# Patient Record
Sex: Male | Born: 1970 | Race: White | Hispanic: No | Marital: Single | Smoking: Current every day smoker
Health system: Southern US, Community
[De-identification: ages and names within clinical notes are randomized; demographics above are authoritative.]

## PROBLEM LIST (undated history)

## (undated) DIAGNOSIS — I10 Essential (primary) hypertension: Secondary | ICD-10-CM

## (undated) DIAGNOSIS — I219 Acute myocardial infarction, unspecified: Secondary | ICD-10-CM

## (undated) HISTORY — DX: Essential (primary) hypertension: I10

## (undated) HISTORY — PX: CARDIAC CATHETERIZATION: SHX172

## (undated) HISTORY — DX: Acute myocardial infarction, unspecified: I21.9

---

## 2006-06-12 ENCOUNTER — Emergency Department: Payer: Self-pay | Admitting: Emergency Medicine

## 2006-12-21 ENCOUNTER — Emergency Department: Payer: Self-pay | Admitting: Emergency Medicine

## 2007-01-26 ENCOUNTER — Emergency Department: Payer: Self-pay | Admitting: Emergency Medicine

## 2007-01-27 ENCOUNTER — Inpatient Hospital Stay: Payer: Self-pay | Admitting: Internal Medicine

## 2007-11-13 ENCOUNTER — Emergency Department: Payer: Self-pay | Admitting: Internal Medicine

## 2009-07-20 IMAGING — CT CT STONE STUDY
1 of 2 series · 15 of 32 positions shown, 19 images · non-contrast
Comparison: none

REASON FOR EXAM: Flank pain
COMMENTS:

[Series 2: stone · axial · 0.70mm/px · z∈[-1024,-622]mm · 15 of 151 slices shown, 19 images]
[im 11/151  soft-tissue]
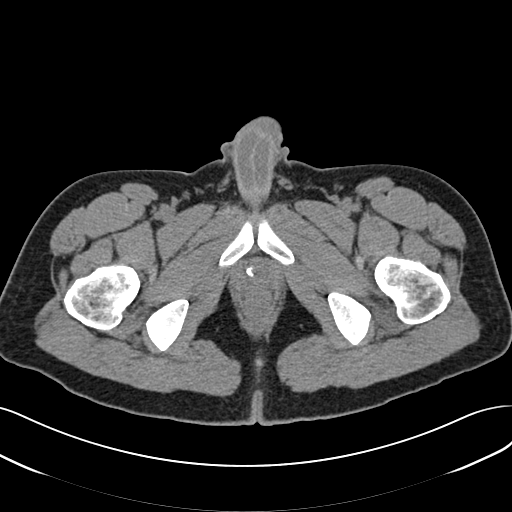
[im 11/151  bone]
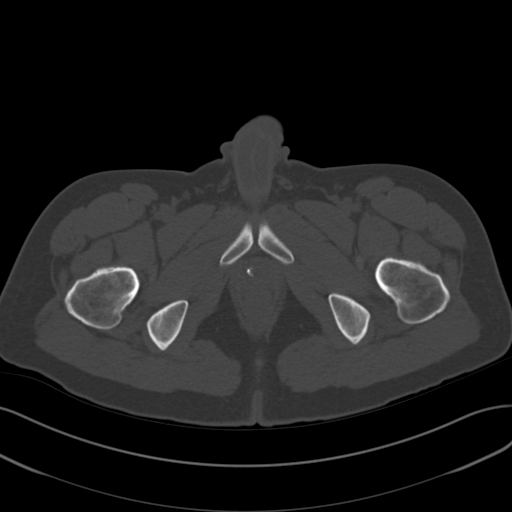
[im 22/151  soft-tissue]
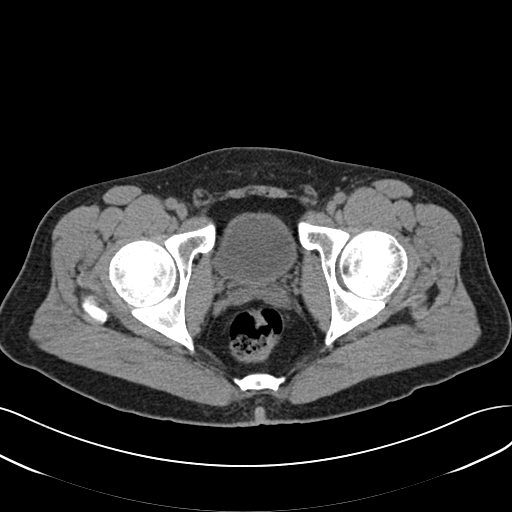
[im 33/151  soft-tissue]
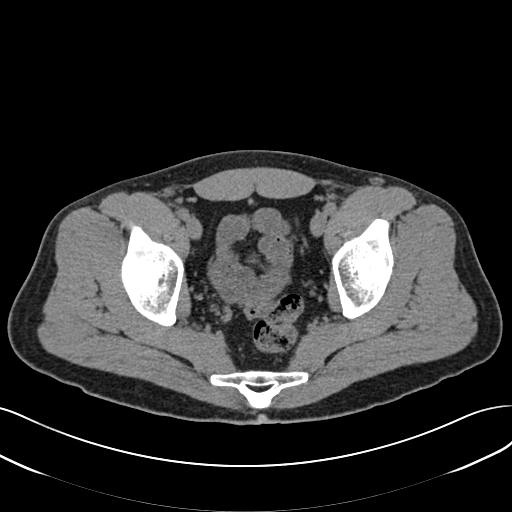
[im 43/151  soft-tissue]
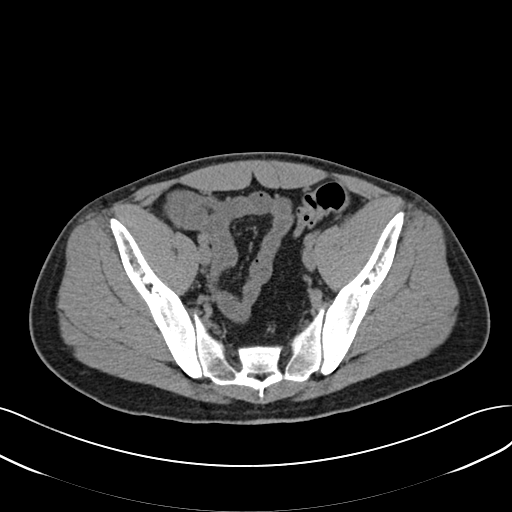
[im 54/151  soft-tissue]
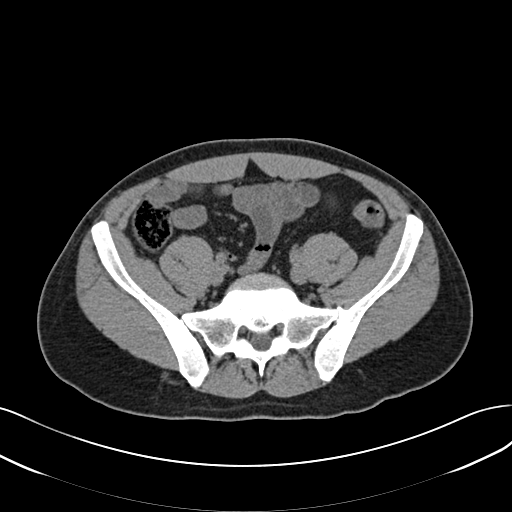
[im 65/151  soft-tissue]
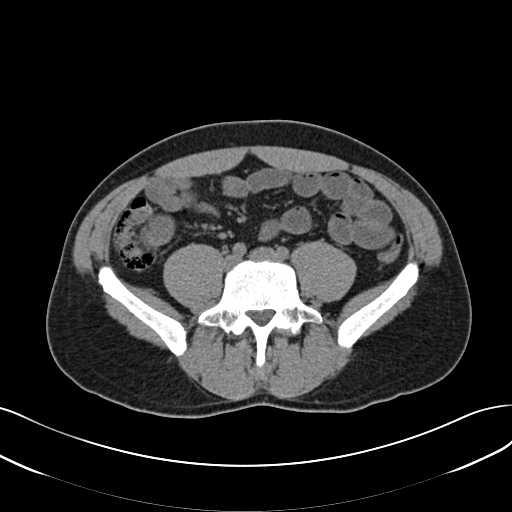
[im 76/151  soft-tissue]
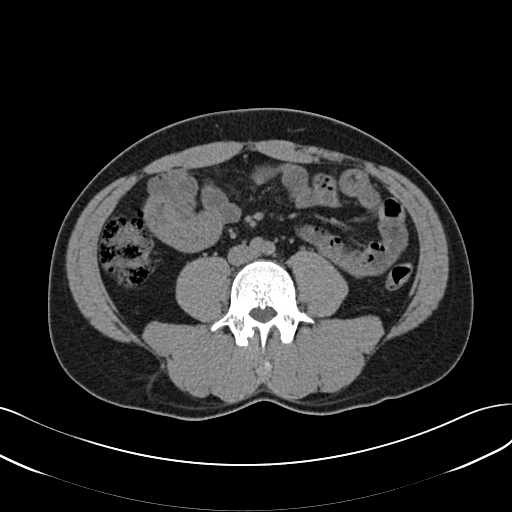
[im 86/151  soft-tissue]
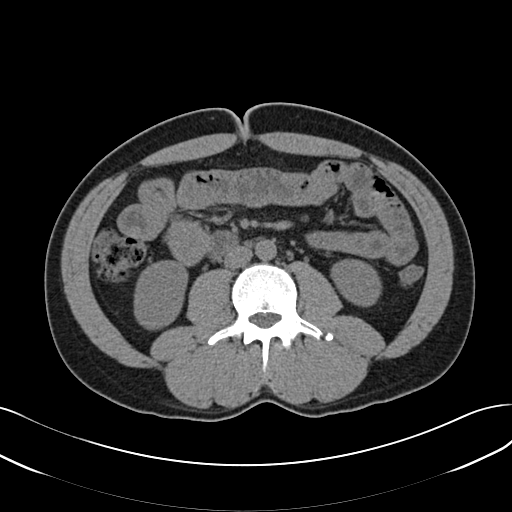
[im 97/151  soft-tissue]
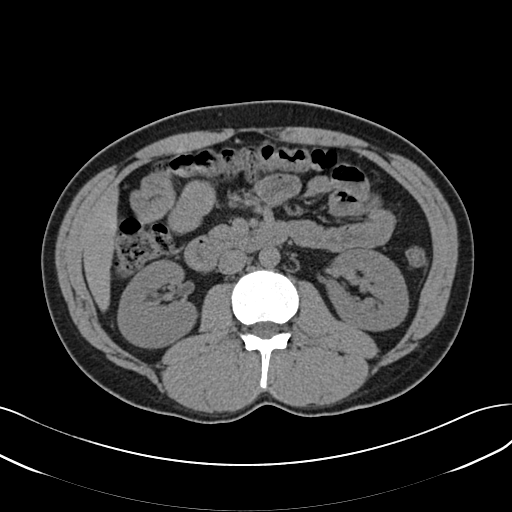
[im 97/151  bone]
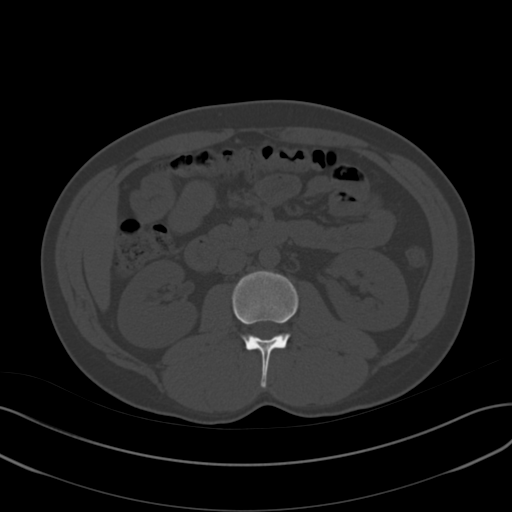
[im 108/151  soft-tissue]
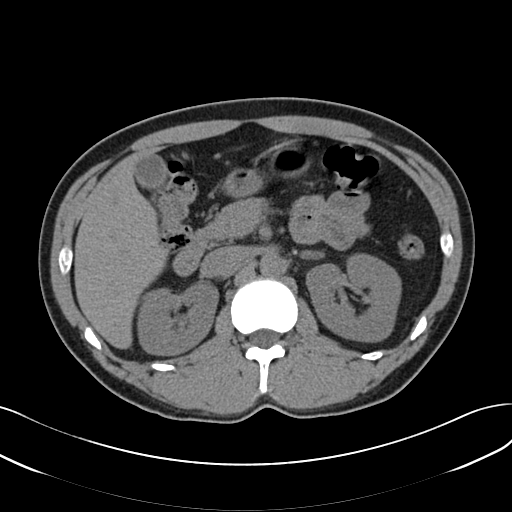
[im 118/151  soft-tissue]
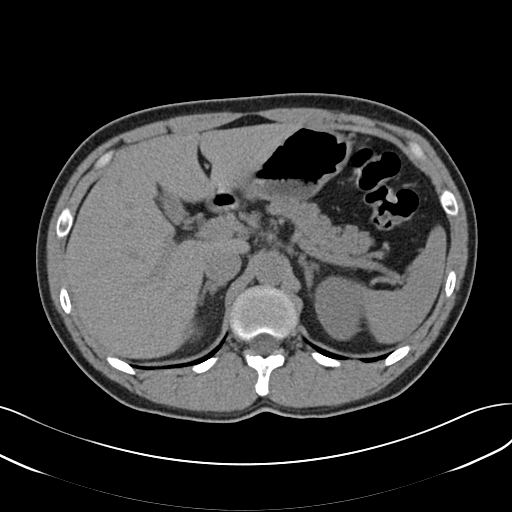
[im 129/151  soft-tissue]
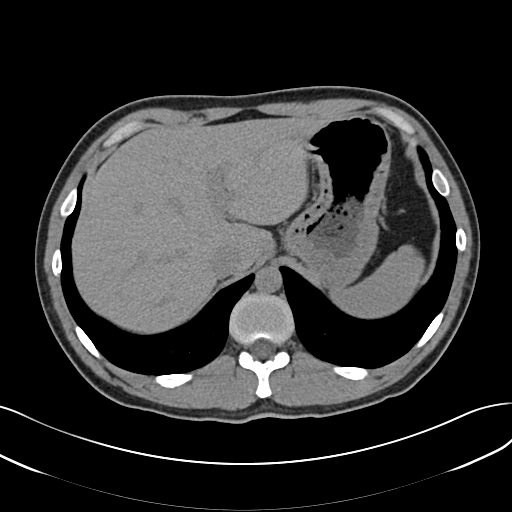
[im 129/151  lung]
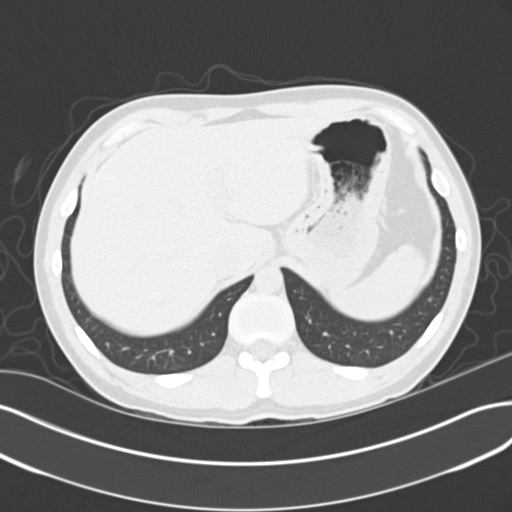
[im 134/151  lung]
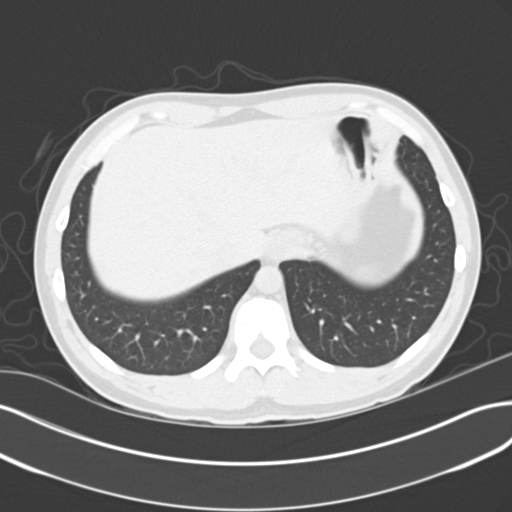
[im 140/151  soft-tissue]
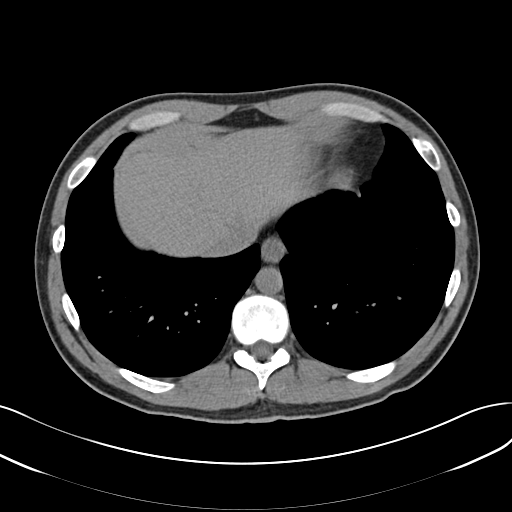
[im 140/151  lung]
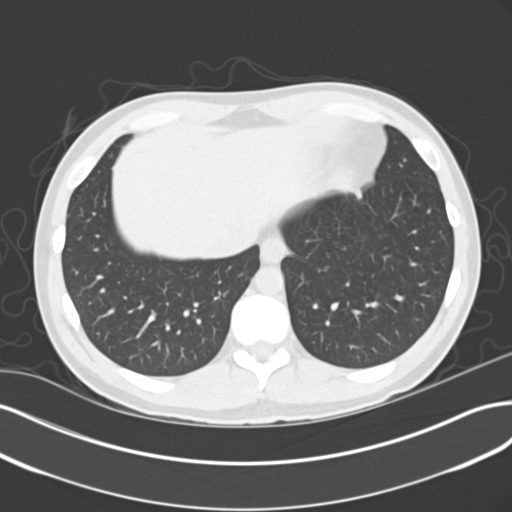
[im 145/151  lung]
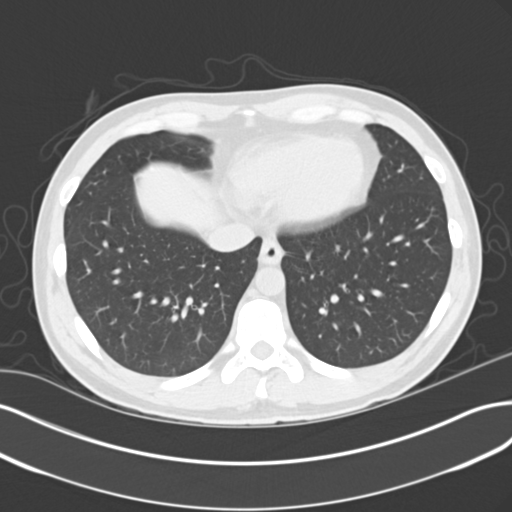

[15 of 32 positions shown; findings below may reference images not displayed]

PROCEDURE:     CT  - CT ABDOMEN /PELVIS WO (STONE)  - December 21, 2006  [DATE]

RESULT:     The study was performed without IV contrast as requested.

The kidneys exhibit normal density and contour. There is no hydronephrosis.
No calcified stones are seen. The unopacified loops of small and large bowel
exhibit no evidence of obstruction or colitis or diverticulitis. Specific
attention to the sigmoid colon reveals no acute abnormality. There is a
structure that appears to reflect a normal appendix containing gas. There is
no free fluid in the abdomen or pelvis. The urinary bladder, prostate gland
and seminal vesicles are normal for the noncontrast study. There is no
inguinal hernia and no bulky pelvic or inguinal lymph nodes are identified.

The caliber of the abdominal aorta is normal. There are no adrenal masses.
The spleen, stomach, pancreas, gallbladder and liver exhibit no acute
abnormality. The lung bases are clear.
IMPRESSION: 1.  I do not see evidence of acute urinary tract obstruction, stones or
inflammatory change.
2.  There is no evidence of diverticulitis or acute appendicitis or other
forms of bowel abnormality.
3.  There is no evidence of ascites and no evidence of hepatobiliary
abnormality.

This report was called to Dr. Genga in the Emergency Room at the
conclusion of the study.

## 2009-10-23 ENCOUNTER — Emergency Department: Payer: Self-pay | Admitting: Emergency Medicine

## 2016-03-23 ENCOUNTER — Inpatient Hospital Stay (HOSPITAL_COMMUNITY)
Admit: 2016-03-23 | Discharge: 2016-03-23 | Disposition: A | Discharge status: Home / Self Care | Payer: Self-pay | Attending: Internal Medicine | Admitting: Internal Medicine

## 2016-03-23 ENCOUNTER — Emergency Department: Payer: Self-pay

## 2016-03-23 ENCOUNTER — Inpatient Hospital Stay
Admission: EM | Admit: 2016-03-23 | Discharge: 2016-03-25 | DRG: 282 | Disposition: A | Discharge status: Home / Self Care | Payer: Self-pay | Attending: Cardiovascular Disease | Admitting: Cardiovascular Disease

## 2016-03-23 ENCOUNTER — Encounter: Payer: Self-pay | Admitting: Radiology

## 2016-03-23 DIAGNOSIS — R9431 Abnormal electrocardiogram [ECG] [EKG]: Secondary | ICD-10-CM

## 2016-03-23 DIAGNOSIS — F4310 Post-traumatic stress disorder, unspecified: Secondary | ICD-10-CM

## 2016-03-23 DIAGNOSIS — A419 Sepsis, unspecified organism: Secondary | ICD-10-CM

## 2016-03-23 DIAGNOSIS — F1411 Cocaine abuse, in remission: Secondary | ICD-10-CM

## 2016-03-23 DIAGNOSIS — R51 Headache: Secondary | ICD-10-CM | POA: Diagnosis present

## 2016-03-23 DIAGNOSIS — I214 Non-ST elevation (NSTEMI) myocardial infarction: Principal | ICD-10-CM

## 2016-03-23 DIAGNOSIS — Z91199 Patient's noncompliance with other medical treatment and regimen due to unspecified reason: Secondary | ICD-10-CM

## 2016-03-23 DIAGNOSIS — Z800 Family history of malignant neoplasm of digestive organs: Secondary | ICD-10-CM

## 2016-03-23 DIAGNOSIS — Z803 Family history of malignant neoplasm of breast: Secondary | ICD-10-CM

## 2016-03-23 DIAGNOSIS — G4700 Insomnia, unspecified: Secondary | ICD-10-CM | POA: Diagnosis present

## 2016-03-23 DIAGNOSIS — F17210 Nicotine dependence, cigarettes, uncomplicated: Secondary | ICD-10-CM | POA: Diagnosis present

## 2016-03-23 DIAGNOSIS — F1210 Cannabis abuse, uncomplicated: Secondary | ICD-10-CM

## 2016-03-23 DIAGNOSIS — F410 Panic disorder [episodic paroxysmal anxiety] without agoraphobia: Secondary | ICD-10-CM | POA: Diagnosis present

## 2016-03-23 DIAGNOSIS — R1033 Periumbilical pain: Secondary | ICD-10-CM

## 2016-03-23 DIAGNOSIS — Z8249 Family history of ischemic heart disease and other diseases of the circulatory system: Secondary | ICD-10-CM

## 2016-03-23 DIAGNOSIS — R519 Headache, unspecified: Secondary | ICD-10-CM

## 2016-03-23 DIAGNOSIS — Z9119 Patient's noncompliance with other medical treatment and regimen: Secondary | ICD-10-CM

## 2016-03-23 LAB — URINALYSIS, ROUTINE W REFLEX MICROSCOPIC
BACTERIA UA: NONE SEEN
Bilirubin Urine: NEGATIVE
Glucose, UA: NEGATIVE mg/dL
KETONES UR: NEGATIVE mg/dL
Leukocytes, UA: NEGATIVE
Nitrite: NEGATIVE
PROTEIN: NEGATIVE mg/dL
SQUAMOUS EPITHELIAL / LPF: NONE SEEN
Specific Gravity, Urine: >1.046 — ABNORMAL HIGH (ref 1.005–1.030)
pH: 5 (ref 5.0–8.0)

## 2016-03-23 LAB — INFLUENZA PANEL BY PCR (TYPE A & B)
Influenza A By PCR: NEGATIVE
Influenza B By PCR: NEGATIVE

## 2016-03-23 LAB — URINE DRUG SCREEN, QUALITATIVE (ARMC ONLY)
Amphetamines, Ur Screen: NOT DETECTED
BARBITURATES, UR SCREEN: NOT DETECTED
BENZODIAZEPINE, UR SCRN: NOT DETECTED
CANNABINOID 50 NG, UR ~~LOC~~: POSITIVE — AB
Cocaine Metabolite,Ur ~~LOC~~: NOT DETECTED
MDMA (ECSTASY) UR SCREEN: NOT DETECTED
Methadone Scn, Ur: NOT DETECTED
Opiate, Ur Screen: POSITIVE — AB
PHENCYCLIDINE (PCP) UR S: NOT DETECTED
Tricyclic, Ur Screen: NOT DETECTED

## 2016-03-23 LAB — CBC
HEMATOCRIT: 43.1 % (ref 40.0–52.0)
HEMOGLOBIN: 15.3 g/dL (ref 13.0–18.0)
MCH: 31.3 pg (ref 26.0–34.0)
MCHC: 35.4 g/dL (ref 32.0–36.0)
MCV: 88.4 fL (ref 80.0–100.0)
Platelets: 390 10*3/uL (ref 150–440)
RBC: 4.88 MIL/uL (ref 4.40–5.90)
RDW: 13.1 % (ref 11.5–14.5)
WBC: 25.5 10*3/uL — ABNORMAL HIGH (ref 3.8–10.6)

## 2016-03-23 LAB — COMPREHENSIVE METABOLIC PANEL
ALBUMIN: 4.7 g/dL (ref 3.5–5.0)
ALT: 25 U/L (ref 17–63)
AST: 26 U/L (ref 15–41)
Alkaline Phosphatase: 75 U/L (ref 38–126)
Anion gap: 11 (ref 5–15)
BILIRUBIN TOTAL: 0.6 mg/dL (ref 0.3–1.2)
BUN: 18 mg/dL (ref 6–20)
CALCIUM: 9.1 mg/dL (ref 8.9–10.3)
CO2: 24 mmol/L (ref 22–32)
CREATININE: 1.16 mg/dL (ref 0.61–1.24)
Chloride: 104 mmol/L (ref 101–111)
GFR calc Af Amer: >60 mL/min (ref >60)
GFR calc non Af Amer: >60 mL/min (ref >60)
GLUCOSE: 170 mg/dL — AB (ref 65–99)
Potassium: 3.8 mmol/L (ref 3.5–5.1)
SODIUM: 139 mmol/L (ref 135–145)
Total Protein: 7.8 g/dL (ref 6.5–8.1)

## 2016-03-23 LAB — ECHOCARDIOGRAM COMPLETE
Height: 68 in
Weight: 2480 oz

## 2016-03-23 LAB — LACTIC ACID, PLASMA
Lactic Acid, Venous: 2.4 mmol/L (ref 0.5–1.9)
Lactic Acid, Venous: 2.1 mmol/L (ref 0.5–1.9)

## 2016-03-23 LAB — TROPONIN I
TROPONIN I: 0.09 ng/mL — AB (ref <0.03)
TROPONIN I: 0.54 ng/mL — AB (ref <0.03)
TROPONIN I: 8.45 ng/mL — AB (ref <0.03)
Troponin I: 0.03 ng/mL (ref <0.03)

## 2016-03-23 LAB — LIPASE, BLOOD: Lipase: 21 U/L (ref 11–51)

## 2016-03-23 MED ORDER — ONDANSETRON HCL 4 MG/2ML IJ SOLN
INTRAMUSCULAR | Status: AC
  Administered 2016-03-23: 4 mg via INTRAVENOUS
  Filled 2016-03-23: qty 2

## 2016-03-23 MED ORDER — HYDROMORPHONE HCL 1 MG/ML IJ SOLN
1.0000 mg | Freq: Once | INTRAMUSCULAR | Status: AC
  Administered 2016-03-23: 1 mg via INTRAVENOUS

## 2016-03-23 MED ORDER — KCL IN DEXTROSE-NACL 20-5-0.9 MEQ/L-%-% IV SOLN
INTRAVENOUS | Status: AC
  Administered 2016-03-23 – 2016-03-24 (×2): via INTRAVENOUS
  Filled 2016-03-23 (×3): qty 1000

## 2016-03-23 MED ORDER — PANTOPRAZOLE SODIUM 40 MG IV SOLR
40.0000 mg | Freq: Every day | INTRAVENOUS | Status: DC
  Administered 2016-03-23 – 2016-03-25 (×3): 40 mg via INTRAVENOUS
  Filled 2016-03-23 (×3): qty 40

## 2016-03-23 MED ORDER — ACETAMINOPHEN 650 MG RE SUPP: 650.0000 mg | Freq: Four times a day (QID) | RECTAL | Status: DC | PRN

## 2016-03-23 MED ORDER — VANCOMYCIN HCL IN DEXTROSE 1-5 GM/200ML-% IV SOLN
1000.0000 mg | Freq: Two times a day (BID) | INTRAVENOUS | Status: DC
  Administered 2016-03-23 – 2016-03-24 (×2): 1000 mg via INTRAVENOUS
  Filled 2016-03-23 (×2): qty 200

## 2016-03-23 MED ORDER — ACETAMINOPHEN 325 MG PO TABS
650.0000 mg | ORAL_TABLET | Freq: Four times a day (QID) | ORAL | Status: DC | PRN
Start: 2016-03-23 — End: 2016-03-25

## 2016-03-23 MED ORDER — FENTANYL CITRATE (PF) 100 MCG/2ML IJ SOLN
100.0000 ug | Freq: Once | INTRAMUSCULAR | Status: AC
  Administered 2016-03-23: 100 ug via INTRAVENOUS
  Filled 2016-03-23: qty 2

## 2016-03-23 MED ORDER — ONDANSETRON HCL 4 MG PO TABS: 4.0000 mg | ORAL_TABLET | Freq: Four times a day (QID) | ORAL | Status: DC | PRN

## 2016-03-23 MED ORDER — IOPAMIDOL (ISOVUE-370) INJECTION 76%
75.0000 mL | Freq: Once | INTRAVENOUS | Status: AC | PRN
  Administered 2016-03-23: 75 mL via INTRAVENOUS

## 2016-03-23 MED ORDER — OXYCODONE HCL 5 MG PO TABS
5.0000 mg | ORAL_TABLET | ORAL | Status: DC | PRN
  Administered 2016-03-23 – 2016-03-25 (×7): 5 mg via ORAL
  Filled 2016-03-23 (×7): qty 1

## 2016-03-23 MED ORDER — HEPARIN (PORCINE) IN NACL 100-0.45 UNIT/ML-% IJ SOLN
1100.0000 [IU]/h | INTRAMUSCULAR | Status: DC
  Administered 2016-03-23: 850 [IU]/h via INTRAVENOUS
  Filled 2016-03-23: qty 250

## 2016-03-23 MED ORDER — ONDANSETRON HCL 4 MG/2ML IJ SOLN
4.0000 mg | Freq: Four times a day (QID) | INTRAMUSCULAR | Status: DC | PRN
  Administered 2016-03-24 (×2): 4 mg via INTRAVENOUS
  Filled 2016-03-23 (×2): qty 2

## 2016-03-23 MED ORDER — NICOTINE 21 MG/24HR TD PT24
21.0000 mg | MEDICATED_PATCH | Freq: Every day | TRANSDERMAL | Status: DC
  Administered 2016-03-23 – 2016-03-25 (×3): 21 mg via TRANSDERMAL
  Filled 2016-03-23 (×2): qty 1

## 2016-03-23 MED ORDER — LORAZEPAM 2 MG/ML IJ SOLN
0.5000 mg | Freq: Four times a day (QID) | INTRAMUSCULAR | Status: DC | PRN
  Administered 2016-03-23 – 2016-03-24 (×2): 0.5 mg via INTRAVENOUS
  Filled 2016-03-23 (×3): qty 1

## 2016-03-23 MED ORDER — SODIUM CHLORIDE 0.9 % IV SOLN
Freq: Once | INTRAVENOUS | Status: AC
  Administered 2016-03-23: 1000 mL via INTRAVENOUS

## 2016-03-23 MED ORDER — ATORVASTATIN CALCIUM 20 MG PO TABS
40.0000 mg | ORAL_TABLET | Freq: Every day | ORAL | Status: DC
  Administered 2016-03-23 – 2016-03-24 (×2): 40 mg via ORAL
  Filled 2016-03-23 (×2): qty 2

## 2016-03-23 MED ORDER — PERFLUTREN LIPID MICROSPHERE
1.0000 mL | INTRAVENOUS | Status: AC | PRN
Start: 2016-03-23 — End: 2016-03-23
  Administered 2016-03-23: 4 mL via INTRAVENOUS
  Filled 2016-03-23: qty 10

## 2016-03-23 MED ORDER — VANCOMYCIN HCL IN DEXTROSE 1-5 GM/200ML-% IV SOLN
1000.0000 mg | Freq: Once | INTRAVENOUS | Status: AC
  Administered 2016-03-23: 1000 mg via INTRAVENOUS
  Filled 2016-03-23: qty 200

## 2016-03-23 MED ORDER — ONDANSETRON HCL 4 MG/2ML IJ SOLN
4.0000 mg | Freq: Once | INTRAMUSCULAR | Status: AC
  Administered 2016-03-23: 4 mg via INTRAVENOUS

## 2016-03-23 MED ORDER — ASPIRIN 300 MG RE SUPP
300.0000 mg | Freq: Every day | RECTAL | Status: DC
  Administered 2016-03-23: 300 mg via RECTAL
  Filled 2016-03-23: qty 1

## 2016-03-23 MED ORDER — LORAZEPAM 2 MG/ML IJ SOLN
INTRAMUSCULAR | Status: AC
Start: 2016-03-23 — End: 2016-03-23
  Administered 2016-03-23: 0.5 mg
  Filled 2016-03-23: qty 1

## 2016-03-23 MED ORDER — HYDROMORPHONE HCL 1 MG/ML IJ SOLN
INTRAMUSCULAR | Status: AC
  Filled 2016-03-23: qty 1

## 2016-03-23 MED ORDER — SODIUM CHLORIDE 0.9% FLUSH
3.0000 mL | Freq: Two times a day (BID) | INTRAVENOUS | Status: DC
  Administered 2016-03-23 – 2016-03-25 (×2): 3 mL via INTRAVENOUS

## 2016-03-23 MED ORDER — SODIUM CHLORIDE 0.9 % IV BOLUS (SEPSIS)
1000.0000 mL | Freq: Once | INTRAVENOUS | Status: AC
Start: 2016-03-23 — End: 2016-03-23
  Administered 2016-03-23: 1000 mL via INTRAVENOUS

## 2016-03-23 MED ORDER — PIPERACILLIN-TAZOBACTAM 3.375 G IVPB
3.3750 g | Freq: Three times a day (TID) | INTRAVENOUS | Status: DC
  Administered 2016-03-23 – 2016-03-24 (×2): 3.375 g via INTRAVENOUS
  Filled 2016-03-23 (×2): qty 50

## 2016-03-23 MED ORDER — HEPARIN BOLUS VIA INFUSION
4000.0000 [IU] | Freq: Once | INTRAVENOUS | Status: AC
Start: 2016-03-23 — End: 2016-03-23
  Administered 2016-03-23: 4000 [IU] via INTRAVENOUS
  Filled 2016-03-23: qty 4000

## 2016-03-23 MED ORDER — PIPERACILLIN-TAZOBACTAM 3.375 G IVPB 30 MIN
3.3750 g | Freq: Once | INTRAVENOUS | Status: AC
  Administered 2016-03-23: 3.375 g via INTRAVENOUS
  Filled 2016-03-23: qty 50

## 2016-03-23 MED ORDER — ASPIRIN 325 MG PO TABS
325.0000 mg | ORAL_TABLET | Freq: Every day | ORAL | Status: DC
  Administered 2016-03-24: 325 mg via ORAL
  Filled 2016-03-23: qty 1

## 2016-03-23 MED ORDER — KETOROLAC TROMETHAMINE 15 MG/ML IJ SOLN
15.0000 mg | Freq: Four times a day (QID) | INTRAMUSCULAR | Status: DC | PRN
  Administered 2016-03-23 – 2016-03-25 (×5): 15 mg via INTRAVENOUS
  Filled 2016-03-23 (×5): qty 1

## 2016-03-23 MED ORDER — METOCLOPRAMIDE HCL 5 MG/ML IJ SOLN
5.0000 mg | Freq: Four times a day (QID) | INTRAMUSCULAR | Status: DC | PRN
  Administered 2016-03-23 (×2): 5 mg via INTRAVENOUS
  Filled 2016-03-23 (×2): qty 2

## 2016-03-23 MED ORDER — HYDROMORPHONE HCL 1 MG/ML IJ SOLN
1.0000 mg | Freq: Once | INTRAMUSCULAR | Status: AC
  Administered 2016-03-23: 1 mg via INTRAVENOUS
  Filled 2016-03-23: qty 1

## 2016-03-23 MED ORDER — ENOXAPARIN SODIUM 40 MG/0.4ML ~~LOC~~ SOLN: 40.0000 mg | SUBCUTANEOUS | Status: DC

## 2016-03-23 MED ORDER — ONDANSETRON HCL 4 MG/2ML IJ SOLN
4.0000 mg | Freq: Once | INTRAMUSCULAR | Status: AC
  Administered 2016-03-23: 4 mg via INTRAVENOUS
  Filled 2016-03-23: qty 2

## 2016-03-23 NOTE — ED Provider Notes (Signed)
[facility] Emergency Department Provider Note  Time seen: 7:50 AM  I have reviewed the triage vital signs and the nursing notes.   HISTORY  Chief Complaint No chief complaint on file.    HPI James Abbott is a 46 y.o. male who denies any medical problems, presents the emergency department for severe abdominal pain. According to the patient he will 4:00 this morning with severe periumbilical abdominal pain which she states is 10/10 and sharp. States nausea but denies vomiting. States 2 bowel movements yesterday denies diarrhea. Denies chest pain shortness of breath or diaphoresis. Patient is in moderate distress due to pain. Denies any recent cocaine use, does state mild alcohol use last night for the first time in a long time per patient.  No past medical history on file.  There are no active problems to display for this patient.   No past surgical history on file.  Prior to Admission medications   Not on File    Allergies not on file  No family history on file.  Social History Social History  Substance Use Topics  . Smoking status: Not on file  . Smokeless tobacco: Not on file  . Alcohol use Not on file    Review of Systems Constitutional: Negative for fever. Cardiovascular: Negative for chest pain. Respiratory: Negative for shortness of breath. Gastrointestinal: Severe lower abdominal pain periumbilical to suprapubic. Genitourinary: Negative for dysuria. Musculoskeletal: Negative for back pain. Neurological: Negative for headache 10-point ROS otherwise negative.  ____________________________________________   PHYSICAL EXAM:  Constitutional: Alert and oriented. Well appearing and in no distress. Eyes: Normal exam ENT   Head: Normocephalic and atraumatic.   Mouth/Throat: Mucous membranes are moist. Cardiovascular: Normal rate, regular rhythm. No murmur Respiratory: Normal respiratory effort without tachypnea nor retractions.  Breath sounds are clear  Gastrointestinal: Soft, states severe abdominal pain although no worse with palpation. No mass or pulsatile mass felt. Musculoskeletal: Nontender with normal range of motion in all extremities. No lower extremity tenderness or edema. Neurologic:  Normal speech and language. No gross focal neurologic deficits Skin:  Skin is warm, dry and intact.  Psychiatric: Mood and affect are normal.   ____________________________________________    EKG  EKG reviewed and interpreted by myself shows sinus arrhythmia at 78 bpm, narrow QRS, normal axis, slightly prolonged QTC of 505 ms, patient does appear to have ST elevation in leads 23 aVF however no reciprocal changes are noted  Repeat EKG reviewed and interpreted by myself 7:58:20 shows sinus arrhythmia at 71 bpm, narrow QRS, normal axis, normal intervals, again noted to have ST elevation in 2, 3, aVF but again no reciprocal changes.  Repeat EKG 8:24:42. In interpreted by myself shows normal sinus rhythm at 85 bpm, narrow QRS, normal axis, continues to have ST elevation 2, 3, aVF with no reciprocal depressions.  ____________________________________________    RADIOLOGY  CTA chest abdomen pelvis read as negative  ____________________________________________   INITIAL IMPRESSION / ASSESSMENT AND PLAN / ED COURSE  Pertinent labs & imaging results that were available during my care of the patient were reviewed by me and considered in my medical decision making (see chart for details).  The patient presents to the emergency department with severe 10/10 lower abdominal pain. Describes the pain as sharp, acute onset at 4:00 this morning. Denies vomiting. States 2 normal bowel movements yesterday. Patient denies any chest pain shortness of breath. Patient's EKG is somewhat concerning given elevation in 2-3 aVF however there are no reciprocal changes present  and again the patient denies any pain in the chest. However given the EKG  findings we will discuss with Dr. Kirke CorinArida. He is currently reviewing the EKG. Given the patient's severe abdominal pain in the periumbilical region we'll proceed with a CT angiography dissection protocol.  CTA is read as negative. Patient's white blood cell count is 25,000, troponin is pending. Repeat EKG continues to appear unchanged, 23 aVF is elevated but there are no reciprocal changes.  Troponin is negative. LFTs are normal.  Repeat troponin is 0.09. Discussed this with Dr.Arida, who does not believe this is ACS but will be consult thing on the patient. We will admit to medicine for further workup. Given the patient's elevated lactic acid with abdominal pain with no tenderness this would raise concerns for possible mesenteric ischemia, although negative CTA. Given the uncertainties we will admit the patient to the hospital for further evaluation and workup. ____________________________________________   FINAL CLINICAL IMPRESSION(S) / ED DIAGNOSES  Abdominal pain    Minna AntisKevin Blanka Rockholt, MD 03/23/16 1118

## 2016-03-23 NOTE — Progress Notes (Signed)
Patient troponin 8.45 and still having abd pain. Dr. Kirke CorinArida notified. Orders for an EKG and Heparin gtt ordered.

## 2016-03-23 NOTE — H&P (Signed)
[facility] [place] [facility]   PATIENT NAME: James Abbott    MR#:  [id number]  DATE OF BIRTH:  13-Apr-1970  DATE OF ADMISSION:  03/23/2016  PRIMARY CARE PHYSICIAN: No PCP Per Patient   REQUESTING/REFERRING PHYSICIAN: Dr. Lenard Lance  CHIEF COMPLAINT:   Acute lower abdominal pain HISTORY OF PRESENT ILLNESS:  James Abbott  is a 46 y.o. male with No known past medical history or surgical history other than smoking and previous cocaine use is presenting to the ED with a chief complaint of acute lower abdominal pain which started at around 4 AM this morning associated with nausea. No similar complaints in the past. Denies any diarrhea or vomiting. Denies any alcohol intake. The pain is so intense and aching in nature and patient seemed to be very uncomfortable. Denies any chest pain or shortness of breath. However initial troponin 0.03 and EKG has revealed abnormal ST elevations in the inferior leads as well as V4 to V6. ED physician has discussed with cardiologist Dr. Kirke Corin who has seen the patient and he is aware of the second troponin 0.09 , for now he has  not recommended any but echocardiogram .  PAST MEDICAL HISTORY:  History reviewed. No pertinent past medical history.  PAST SURGICAL HISTOIRY:  History reviewed. No pertinent surgical history.  SOCIAL HISTORY:   Social History  Substance Use Topics  . Smoking status: Not on file  . Smokeless tobacco: Not on file  . Alcohol use Not on file    FAMILY HISTORY:  Mom has breast cancer   DRUG ALLERGIES:  Not on File  REVIEW OF SYSTEMS:  CONSTITUTIONAL: No fever, fatigue or weakness.  EYES: No blurred or double vision.  EARS, NOSE, AND THROAT: No tinnitus or ear pain.  RESPIRATORY: No cough, shortness of breath, wheezing or hemoptysis.  CARDIOVASCULAR: No chest pain, orthopnea, edema.  GASTROINTESTINAL:  has nausea and the lower abdominal pain. Started vomiting after giving Dilaudid GENITOURINARY: No  dysuria, hematuria.  ENDOCRINE: No polyuria, nocturia,  HEMATOLOGY: No anemia, easy bruising or bleeding SKIN: No rash or lesion. MUSCULOSKELETAL: No joint pain or arthritis.   NEUROLOGIC: No tingling, numbness, weakness.  PSYCHIATRY: No anxiety or depression.   MEDICATIONS AT HOME:   Prior to Admission medications   Not on File      VITAL SIGNS:  Blood pressure 111/74, pulse 93, resp. rate (!) 22, height 5\' 8"  (1.727 m), weight 70.3 kg (155 lb), SpO2 97 %.  PHYSICAL EXAMINATION:  GENERAL:  46 y.o.-year-old patient lying in the bed with no acute distress.  EYES: Pupils equal, round, reactive to light and accommodation. No scleral icterus. Extraocular muscles intact.  HEENT: Head atraumatic, normocephalic. Oropharynx and nasopharynx clear.  NECK:  Supple, no jugular venous distention. No thyroid enlargement, no tenderness.  LUNGS: Normal breath sounds bilaterally, no wheezing, rales,rhonchi or crepitation. No use of accessory muscles of respiration.  CARDIOVASCULAR: S1, S2 normal. No murmurs, rubs, or gallops.  ABDOMEN: Soft, lower abdominal tenderness is present but no rebound tenderness, nondistended. Bowel sounds present. No organomegaly or mass.  EXTREMITIES: No pedal edema, cyanosis, or clubbing.  NEUROLOGIC: Cranial nerves II through XII are intact. Muscle strength 5/5 in all extremities. Sensation intact. Gait not checked.  PSYCHIATRIC: The patient is alert and oriented x 3.  SKIN: No obvious rash, lesion, or ulcer.   LABORATORY PANEL:   CBC  Recent Labs Lab 03/23/16 0755  WBC 25.5*  HGB 15.3  HCT 43.1  PLT 390   ------------------------------------------------------------------------------------------------------------------  Chemistries   Recent Labs Lab 03/23/16 0755  NA 139  K 3.8  CL 104  CO2 24  GLUCOSE 170*  BUN 18  CREATININE 1.16  CALCIUM 9.1  AST 26  ALT 25  ALKPHOS 75  BILITOT 0.6    ------------------------------------------------------------------------------------------------------------------  Cardiac Enzymes  Recent Labs Lab 03/23/16 0938  TROPONINI 0.09*   ------------------------------------------------------------------------------------------------------------------  RADIOLOGY:  Ct Angio Chest/abd/pel For Dissection W And/or Wo Contrast  Result Date: 03/23/2016 CLINICAL DATA:  Acute abdominal pain. EXAM: CT ANGIOGRAPHY CHEST, ABDOMEN AND PELVIS TECHNIQUE: Multidetector CT imaging through the chest, abdomen and pelvis was performed using the standard protocol during bolus administration of intravenous contrast. Multiplanar reconstructed images and MIPs were obtained and reviewed to evaluate the vascular anatomy. CONTRAST:  75 cc Isovue 370 IV COMPARISON:  12/21/2006 CT abdomen and pelvis FINDINGS: CTA CHEST FINDINGS Cardiovascular: Heart is normal size. Aorta is normal caliber. No evidence of dissection. Mediastinum/Nodes: No mediastinal, hilar, or axillary adenopathy. Lungs/Pleura: Lungs are clear. No focal airspace opacities or suspicious nodules. No effusions. Musculoskeletal: Chest wall soft tissues are unremarkable. No acute bony abnormality. Review of the MIP images confirms the above findings. CTA ABDOMEN AND PELVIS FINDINGS VASCULAR Aorta: Normal caliber. Mild atherosclerotic disease in the infrarenal aorta. No dissection. Celiac: Widely patent SMA: Widely patent Renals: 2 left and 1 right renal artery, widely patent IMA: Widely patent Inflow: Patent.  No dissection. Veins: Grossly unremarkable. Review of the MIP images confirms the above findings. NON-VASCULAR Hepatobiliary: No focal hepatic abnormality. Apparent pericholecystic fluid. No visible stones or biliary ductal dilatation. Pancreas: No focal abnormality or ductal dilatation. Spleen: No focal abnormality.  Normal size. Adrenals/Urinary Tract: No adrenal abnormality. No focal renal abnormality. No  stones or hydronephrosis. Urinary bladder is unremarkable. Urinary bladder decompressed. Stomach/Bowel: Normal appendix. Stomach, large and small bowel grossly unremarkable. Lymphatic: No adenopathy Reproductive: No visible focal abnormality. Other:No free fluid or free air. Musculoskeletal: No acute bony abnormality. Review of the MIP images confirms the above findings. IMPRESSION: No evidence of aortic aneurysm or dissection. No acute findings in the chest. Small amount of pericholecystic fluid of unknown etiology or significance. No visible stones. If felt clinically indicated, this could be further evaluated with right upper quadrant ultrasound. Electronically Signed   By: Charlett Nose M.D.   On: 03/23/2016 08:25    EKG:   Orders placed or performed during the hospital encounter of 03/23/16  . EKG 12-Lead  . EKG 12-Lead  . EKG 12-Lead  . EKG 12-Lead  . EKG 12-Lead  . EKG 12-Lead  . ED EKG  . ED EKG  . ED EKG  . ED EKG  . EKG 12-Lead  . EKG 12-Lead    IMPRESSION AND PLAN:   Arnol Mcgibbon  is a 46 y.o. male with No known past medical history or surgical history other than smoking and previous cocaine use is presenting to the ED with a chief complaint of acute lower abdominal pain which started at around 4 AM this morning associated with nausea.  # Sepsis meets septic criteria with leukocytosis and elevated lactic acid Admit to telemetry Etiology unclear at this time Chest x-ray is normal Influenza test is ordered Blood cultures and urine cultures were ordered in the ED Patient started on IV fluids and empiric antibiotics Zosyn and vancomycin pharmacy to dose Septic protocol  #Abnormal troponin and abnormal EKG possible inferior lead STEMI Initial troponin 0.03-0.09 Telemetry monitoring Patient was seen and evaluated by cardiology Dr. Kirke Corin who has recommended to  monitor patient closely at this time and obtain echocardiogram and will consider cardiac catheterization if troponins are  trending up Aspirin  #Acute lower abdominal pain Unclear etiology at this time could be from cocaine induced ischemia CT chest, abdomen and pelvis are normal IV fluids and supportive treatment Monitor labs GI consult placed Pain management as needed   #Nausea and vomiting from the adverse effect of pain medicine IV fluids and Zofran Supportive treatment  #Tobacco abuse disorder Counseled patient to quit smoking for 5 minutes. He verbalized understanding We will start him on nicotine patch from tomorrow after ruling out acute MI   GI prophylaxis with Protonix DVT prophylaxis with Lovenox subcutaneous  All the records are reviewed and case discussed with ED provider. Management plans discussed with the patient, family and they are in agreement.  CODE STATUS: fc . Sr. Bonita QuinLinda is the healthcare power of attorney  TOTAL TIME TAKING CARE OF THIS PATIENT: 45 minutes.   Note: This dictation was prepared with Dragon dictation along with smaller phrase technology. Any transcriptional errors that result from this process are unintentional.  Ramonita LabGouru, Bao Bazen M.D on 03/23/2016 at 11:14 AM  Between 7am to 6pm - Pager - [phone]  After 6pm go to [email or web address] - password [email or web address] [facility]  [facility]  Office  [phone]  CC: Primary care physician; No PCP Per Patient

## 2016-03-23 NOTE — Progress Notes (Addendum)
ANTICOAGULATION CONSULT NOTE - Initial Consult  Pharmacy Consult for Heparin  Indication: chest pain/ACS  Allergies  Allergen Reactions  . Fluoxetine   . Haloperidol And Related     Patient Measurements: Height: 5\' 8"  (172.7 cm) Weight: 155 lb (70.3 kg) IBW/kg (Calculated) : 68.4 Heparin Dosing Weight: 70.3 kg   Vital Signs: Temp: 98.7 F (37.1 C) (02/10 1212) Temp Source: Oral (02/10 1212) BP: 122/85 (02/10 1212) Pulse Rate: 70 (02/10 1212)  Labs:  Recent Labs  03/23/16 0755 03/23/16 0938 03/23/16 1238 03/23/16 1751  HGB 15.3  --   --   --   HCT 43.1  --   --   --   PLT 390  --   --   --   CREATININE 1.16  --   --   --   TROPONINI 0.03* 0.09* 0.54* 8.45*    Estimated Creatinine Clearance: 78.6 mL/min (by C-G formula based on SCr of 1.16 mg/dL).   Medical History: History reviewed. No pertinent past medical history.  Medications:  Scheduled:  . aspirin  300 mg Rectal Daily  . enoxaparin (LOVENOX) injection  40 mg Subcutaneous Q24H  . heparin  4,000 Units Intravenous Once  . nicotine  21 mg Transdermal Daily  . pantoprazole (PROTONIX) IV  40 mg Intravenous Daily  . piperacillin-tazobactam (ZOSYN)  IV  3.375 g Intravenous Q8H  . sodium chloride flush  3 mL Intravenous Q12H  . vancomycin  1,000 mg Intravenous Q12H    Assessment: Pharmacy consulted to dose heparin in this 46 year old male admitted with ACS/NSTEMI.  No prior anticoagulation noted.  CrCl = 78.6 ml/min  Goal of Therapy:  Heparin level 0.3-0.7 units/ml Monitor platelets by anticoagulation protocol: Yes   Plan:  Will order Heparin 4000 units IV X 1 bolus and begin heparin drip @ 850 units/hr. Will draw 1st HL 6 hrs after start of drip. Will check H&H daily.   2/11 0300 heparin level 0.16. 2100 unit bolus and increase rate to 1100 units/hr. Recheck in 6 hours.   Fulton ReekMatt Dashanique Brownstein, PharmD, BCPS  03/24/16

## 2016-03-23 NOTE — Progress Notes (Signed)
Pharmacy Antibiotic Note  Tedd SiasDavid W Brennen is a 46 y.o. male admitted on 03/23/2016 with sepsis.  Pharmacy has been consulted for piperacillin/tazobactam and vancomycin dosing. Patient received piperacillin/tazo and vancomycin x 1 in ED  Plan: Begin piperacillin/tazobactam 3.375 g EI q 8 hours  Begin vancomycin 1000 mg IV q 12 hours (6 hour stacked dosing) based on actual BW. Trough to be obtained prior to 5th dose (2/12 @ 0430) with goal 15-20. Estimated Css = 16.78 PK: T1/2 9.76; ke = 0.071; Vd = 49.2   Height: 5\' 8"  (172.7 cm) Weight: 155 lb (70.3 kg) IBW/kg (Calculated) : 68.4  No data recorded.   Recent Labs Lab 03/23/16 0755 03/23/16 0938  WBC 25.5*  --   CREATININE 1.16  --   LATICACIDVEN  --  2.4*    Estimated Creatinine Clearance: 78.6 mL/min (by C-G formula based on SCr of 1.16 mg/dL).    Allergies  Allergen Reactions  . Fluoxetine   . Haloperidol And Related     Antimicrobials this admission: Piperacillin/tazobactam 2/10 >>  vancomycin 2/10 >>   Dose adjustments this admission:  Microbiology results: 2/10 BCx: pending 2/10 UCx: pending   Sputum:    MRSA PCR:   Thank you for allowing pharmacy to be a part of this patient's care.  Horris LatinoHolly Gilliam, PharmD Pharmacy Resident 03/23/2016 11:53 AM

## 2016-03-23 NOTE — Progress Notes (Signed)
Family Meeting Note  Advance Directive:yes  Today a meeting took place with the Patient.   The following clinical team members were present during this meeting:MD  The following were discussed:Patient's diagnosis: , Patient's progosis: Unable to determine and Goals for treatment: Full Code Sister. Bonita QuinLinda is the healthcare power of attorney  Additional follow-up to be provided: Hospitalist, gastroenterology and cardiology  Time spent during discussion:18 min  Khrista Braun, Deanna ArtisAruna, MD

## 2016-03-23 NOTE — ED Notes (Signed)
Called Code Sepsis  1109

## 2016-03-23 NOTE — ED Notes (Signed)
Dr arida at bedside.  

## 2016-03-23 NOTE — ED Notes (Signed)
Critical lactic reported to paduchowski 

## 2016-03-23 NOTE — Consult Note (Signed)
CARDIOLOGY CONSULT NOTE  Patient ID: James Abbott MRN: [id number] DOB/AGE: 02/23/1970 46 y.o.  Admit date: 03/23/2016 Referring Physician:  Dr. Lenard LancePaduchowski Primary Physician: No PCP Per Patient Primary Cardiologist : New Reason for Consultation : abnormal ECG  Date:  03/23/2016    Chief Complaint  Patient presents with  . Abdominal Pain      History of Present Illness: James SiasDavid W Portee is a 46 y.o. male with no previous cardiac history and no significant chronic medical conditions other than tobacco use and previous cocaine use. He presented to the emergency room with acute onset lower abdominal pain which started around 4:00 this morning and has been continuous since then. The pain is severe and described as aching with no radiation. He has no epigastric pain or chest discomfort at all. He appears uncomfortable. No significant shortness of breath. He denies change in bowel movements and no nausea or vomiting. The patient's EKG was abnormal with borderline ST elevation in the inferior leads as well as V4 to V6. The morphology is suggestive of early repolarization and not consistent with ST elevation myocardial infarction. First troponin came back borderline at 0.03. So far his workup has been unremarkable except for white cell count of 25,000. CT abdomen was overall unremarkable except for small amount of pericholecystic fluid.    History reviewed. No pertinent past medical history.  History reviewed. No pertinent surgical history.   Current Facility-Administered Medications  Medication Dose Route Frequency Provider Last Rate Last Dose  . HYDROmorphone (DILAUDID) 1 MG/ML injection            No current outpatient prescriptions on file.    Allergies:   Patient has no allergy information on record.    Social History:  The patient Is remarkable for smoking a few cigarettes a day. 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 He drinks alcohol  occasionally but not on a regular basis.  Family History:  The patient's family history is negative for premature coronary artery disease.   ROS:  Please see the history of present illness.   Otherwise, review of systems are positive for none.   All other systems are reviewed and negative.    PHYSICAL EXAM: VS:  BP 121/67   Pulse 77   Resp (!) 23   SpO2 100%  , BMI There is no height or weight on file to calculate BMI. GEN: Well nourished, well developed, in no acute distress  HEENT: normal  Neck: no JVD, carotid bruits, or masses Cardiac: RRR; no murmurs, rubs, or gallops,no edema  Respiratory:  clear to auscultation bilaterally, normal work of breathing GI: soft, nontender, nondistended, + BS MS: no deformity or atrophy  Skin: warm and dry, no rash Neuro:  Strength and sensation are intact Psych: euthymic mood, full affect   EKG:   Personally reviewed by me and showed: Normal sinus rhythm with borderline inferior ST elevation with no reciprocal changes. Morphology is suggestive of early repolarization.  Recent Labs: 03/23/2016: ALT 25; BUN 18; Creatinine, Ser 1.16; Hemoglobin 15.3; Platelets 390; Potassium 3.8; Sodium 139    Lipid Panel No results found for: CHOL, TRIG, HDL, CHOLHDL, VLDL, LDLCALC, LDLDIRECT    Wt Readings from Last 3 Encounters:  No data found for Wt        ASSESSMENT AND PLAN:  1.  Abdominal pain with leukocytosis: No clear etiology. The pain is in the lower abdomen and this would be a very  unusual location for myocardial infarction. I agree with continued workup for other etiologies.  2. Abnormal EKG suggestive of possible inferior ST elevation myocardial infarction: However, the EKG does not fully meet the criteria and the presentation is very atypical. First troponin was borderline at 0.03. I recommend repeat troponin now to see the trend. If there is a change in troponin, I recommend proceeding with urgent cardiac catheterization. If however there  is no change, I recommend an echocardiogram and completing workup for abdominal pain.   Signed,  Lorine Bears, MD  03/23/2016 9:55 AM    [facility]

## 2016-03-23 NOTE — Progress Notes (Signed)
Patient troponin .54 and had a six beat run of vtach. Dr. Kirke CorinArida notified no orders at this time. Will continue to monitor.

## 2016-03-23 NOTE — ED Triage Notes (Signed)
[facility] with complaint of bilateral ABD Pain with radiation through to back. Acute onset woke patient from sleep.  Patient is noted to have questionable ECG with ems, MD Paduchowski notified. Patient is restless and moaning on stretcher.

## 2016-03-23 NOTE — Progress Notes (Signed)
*  PRELIMINARY RESULTS* Echocardiogram 2D Echocardiogram has been performed. Definity Contrast used during this echo.  Johnathan HausenMUCKER, Geanette Buonocore M 03/23/2016, 4:53 PM

## 2016-03-24 ENCOUNTER — Inpatient Hospital Stay: Payer: Self-pay

## 2016-03-24 ENCOUNTER — Encounter
Admission: EM | Disposition: A | Discharge status: Home / Self Care | Payer: Self-pay | Source: Home / Self Care | Attending: Cardiovascular Disease

## 2016-03-24 DIAGNOSIS — I214 Non-ST elevation (NSTEMI) myocardial infarction: Secondary | ICD-10-CM

## 2016-03-24 HISTORY — PX: LEFT HEART CATH AND CORONARY ANGIOGRAPHY: CATH118249

## 2016-03-24 LAB — LIPID PANEL
CHOL/HDL RATIO: 3.5 ratio
Cholesterol: 181 mg/dL (ref 0–200)
HDL: 51 mg/dL (ref >40)
LDL Cholesterol: 113 mg/dL — ABNORMAL HIGH (ref 0–99)
TRIGLYCERIDES: 85 mg/dL (ref <150)
VLDL: 17 mg/dL (ref 0–40)

## 2016-03-24 LAB — COMPREHENSIVE METABOLIC PANEL
ALK PHOS: 59 U/L (ref 38–126)
ALT: 32 U/L (ref 17–63)
ANION GAP: 5 (ref 5–15)
AST: 103 U/L — ABNORMAL HIGH (ref 15–41)
Albumin: 3.5 g/dL (ref 3.5–5.0)
BUN: 18 mg/dL (ref 6–20)
CALCIUM: 8.5 mg/dL — AB (ref 8.9–10.3)
CHLORIDE: 108 mmol/L (ref 101–111)
CO2: 24 mmol/L (ref 22–32)
CREATININE: 0.85 mg/dL (ref 0.61–1.24)
Glucose, Bld: 107 mg/dL — ABNORMAL HIGH (ref 65–99)
Potassium: 3.6 mmol/L (ref 3.5–5.1)
SODIUM: 137 mmol/L (ref 135–145)
Total Bilirubin: 0.9 mg/dL (ref 0.3–1.2)
Total Protein: 6.2 g/dL — ABNORMAL LOW (ref 6.5–8.1)

## 2016-03-24 LAB — CBC
HCT: 37.9 % — ABNORMAL LOW (ref 40.0–52.0)
HEMOGLOBIN: 13.3 g/dL (ref 13.0–18.0)
MCH: 30.9 pg (ref 26.0–34.0)
MCHC: 35.1 g/dL (ref 32.0–36.0)
MCV: 87.9 fL (ref 80.0–100.0)
PLATELETS: 313 10*3/uL (ref 150–440)
RBC: 4.31 MIL/uL — AB (ref 4.40–5.90)
RDW: 13.3 % (ref 11.5–14.5)
WBC: 12.5 10*3/uL — AB (ref 3.8–10.6)

## 2016-03-24 LAB — TSH: TSH: 0.918 u[IU]/mL (ref 0.350–4.500)

## 2016-03-24 LAB — GLUCOSE, CAPILLARY: GLUCOSE-CAPILLARY: 111 mg/dL — AB (ref 65–99)

## 2016-03-24 LAB — HEPARIN LEVEL (UNFRACTIONATED)
HEPARIN UNFRACTIONATED: 0.16 [IU]/mL — AB (ref 0.30–0.70)
HEPARIN UNFRACTIONATED: 0.12 [IU]/mL — AB (ref 0.30–0.70)

## 2016-03-24 LAB — TROPONIN I: Troponin I: 24.53 ng/mL (ref <0.03)

## 2016-03-24 LAB — LIPASE, BLOOD: Lipase: 46 U/L (ref 11–51)

## 2016-03-24 SURGERY — LEFT HEART CATH AND CORONARY ANGIOGRAPHY
Anesthesia: Moderate Sedation

## 2016-03-24 MED ORDER — HEPARIN (PORCINE) IN NACL 100-0.45 UNIT/ML-% IJ SOLN
1250.0000 [IU]/h | INTRAMUSCULAR | Status: DC
  Administered 2016-03-24: 1100 [IU]/h via INTRAVENOUS
  Filled 2016-03-24: qty 250

## 2016-03-24 MED ORDER — SODIUM CHLORIDE 0.9 % IV SOLN: INTRAVENOUS | Status: AC

## 2016-03-24 MED ORDER — VERAPAMIL HCL 2.5 MG/ML IV SOLN
INTRAVENOUS | Status: DC | PRN
  Administered 2016-03-24: 10:00:00 via INTRA_ARTERIAL

## 2016-03-24 MED ORDER — VERAPAMIL HCL 2.5 MG/ML IV SOLN
INTRAVENOUS | Status: AC
  Filled 2016-03-24: qty 2

## 2016-03-24 MED ORDER — NITROGLYCERIN 5 MG/ML IV SOLN
INTRAVENOUS | Status: AC
Start: 2016-03-24 — End: 2016-03-24
  Filled 2016-03-24: qty 10

## 2016-03-24 MED ORDER — HEPARIN BOLUS VIA INFUSION
2100.0000 [IU] | Freq: Once | INTRAVENOUS | Status: AC
  Administered 2016-03-24: 2100 [IU] via INTRAVENOUS
  Filled 2016-03-24: qty 2100

## 2016-03-24 MED ORDER — HEPARIN SODIUM (PORCINE) 1000 UNIT/ML IJ SOLN
INTRAMUSCULAR | Status: DC | PRN
  Administered 2016-03-24: 3500 [IU] via INTRAVENOUS

## 2016-03-24 MED ORDER — LIDOCAINE HCL (PF) 1 % IJ SOLN: INTRAMUSCULAR | Status: DC | PRN

## 2016-03-24 MED ORDER — SODIUM CHLORIDE 0.9 % IV SOLN: 250.0000 mL | INTRAVENOUS | Status: DC | PRN

## 2016-03-24 MED ORDER — LORAZEPAM 2 MG/ML IJ SOLN
1.0000 mg | Freq: Four times a day (QID) | INTRAMUSCULAR | Status: DC | PRN
  Administered 2016-03-24 – 2016-03-25 (×5): 1 mg via INTRAVENOUS
  Filled 2016-03-24 (×5): qty 1

## 2016-03-24 MED ORDER — FENTANYL CITRATE (PF) 100 MCG/2ML IJ SOLN
INTRAMUSCULAR | Status: AC
  Filled 2016-03-24: qty 2

## 2016-03-24 MED ORDER — ASPIRIN 81 MG PO CHEW: 81.0000 mg | CHEWABLE_TABLET | ORAL | Status: DC

## 2016-03-24 MED ORDER — TRAZODONE HCL 100 MG PO TABS
100.0000 mg | ORAL_TABLET | Freq: Every day | ORAL | Status: DC
  Administered 2016-03-24: 100 mg via ORAL
  Filled 2016-03-24: qty 1

## 2016-03-24 MED ORDER — SODIUM CHLORIDE 0.9 % IV SOLN: INTRAVENOUS | Status: DC

## 2016-03-24 MED ORDER — SODIUM CHLORIDE 0.9% FLUSH: 3.0000 mL | INTRAVENOUS | Status: DC | PRN

## 2016-03-24 MED ORDER — HEPARIN BOLUS VIA INFUSION
2100.0000 [IU] | Freq: Once | INTRAVENOUS | Status: AC
Start: 2016-03-24 — End: 2016-03-24
  Administered 2016-03-24: 2100 [IU] via INTRAVENOUS
  Filled 2016-03-24: qty 2100

## 2016-03-24 MED ORDER — HEPARIN SODIUM (PORCINE) 1000 UNIT/ML IJ SOLN
INTRAMUSCULAR | Status: AC
  Filled 2016-03-24: qty 1

## 2016-03-24 MED ORDER — SODIUM CHLORIDE 0.9% FLUSH
3.0000 mL | Freq: Two times a day (BID) | INTRAVENOUS | Status: DC
  Administered 2016-03-24: 3 mL via INTRAVENOUS

## 2016-03-24 MED ORDER — MIDAZOLAM HCL 2 MG/2ML IJ SOLN
INTRAMUSCULAR | Status: DC | PRN
  Administered 2016-03-24: 2 mg via INTRAVENOUS

## 2016-03-24 MED ORDER — HEPARIN (PORCINE) IN NACL 2-0.9 UNIT/ML-% IJ SOLN
INTRAMUSCULAR | Status: AC
  Filled 2016-03-24: qty 500

## 2016-03-24 MED ORDER — FENTANYL CITRATE (PF) 100 MCG/2ML IJ SOLN
INTRAMUSCULAR | Status: DC | PRN
  Administered 2016-03-24: 50 ug via INTRAVENOUS

## 2016-03-24 MED ORDER — SODIUM CHLORIDE 0.9% FLUSH
3.0000 mL | Freq: Two times a day (BID) | INTRAVENOUS | Status: DC
  Administered 2016-03-24 – 2016-03-25 (×2): 3 mL via INTRAVENOUS

## 2016-03-24 MED ORDER — ASPIRIN 81 MG PO CHEW
81.0000 mg | CHEWABLE_TABLET | Freq: Every day | ORAL | Status: DC
  Administered 2016-03-24 – 2016-03-25 (×2): 81 mg via ORAL
  Filled 2016-03-24 (×2): qty 1

## 2016-03-24 MED ORDER — GADOBENATE DIMEGLUMINE 529 MG/ML IV SOLN
20.0000 mL | Freq: Once | INTRAVENOUS | Status: AC | PRN
  Administered 2016-03-24: 20 mL via INTRAVENOUS

## 2016-03-24 MED ORDER — IOPAMIDOL (ISOVUE-300) INJECTION 61%
INTRAVENOUS | Status: DC | PRN
  Administered 2016-03-24: 75 mL via INTRA_ARTERIAL

## 2016-03-24 MED ORDER — MIDAZOLAM HCL 2 MG/2ML IJ SOLN
INTRAMUSCULAR | Status: AC
  Filled 2016-03-24: qty 2

## 2016-03-24 MED ORDER — TRAMADOL HCL 50 MG PO TABS: 50.0000 mg | ORAL_TABLET | Freq: Four times a day (QID) | ORAL | Status: DC | PRN

## 2016-03-24 MED ORDER — NICOTINE 21 MG/24HR TD PT24
21.0000 mg | MEDICATED_PATCH | Freq: Once | TRANSDERMAL | Status: DC
  Administered 2016-03-24: 21 mg via TRANSDERMAL
  Filled 2016-03-24: qty 1

## 2016-03-24 SURGICAL SUPPLY — 7 items
CATH INFINITI 5FR ANG PIGTAIL (CATHETERS) ×3 IMPLANT
CATH OPTITORQUE JACKY 4.0 5F (CATHETERS) ×3 IMPLANT
DEVICE RAD TR BAND REGULAR (VASCULAR PRODUCTS) ×3 IMPLANT
GLIDESHEATH SLEND SS 6F .021 (SHEATH) ×3 IMPLANT
KIT MANI 3VAL PERCEP (MISCELLANEOUS) ×3 IMPLANT
PACK CARDIAC CATH (CUSTOM PROCEDURE TRAY) ×3 IMPLANT
WIRE ROSEN-J .035X260CM (WIRE) ×3 IMPLANT

## 2016-03-24 NOTE — Progress Notes (Signed)
ANTICOAGULATION CONSULT NOTE - Initial Consult  Pharmacy Consult for Heparin  Indication: chest pain/ACS  Allergies  Allergen Reactions  . Fluoxetine   . Haloperidol And Related     Patient Measurements: Height: 5\' 8"  (172.7 cm) Weight: 218 lb 7.6 oz (99.1 kg) IBW/kg (Calculated) : 68.4 Heparin Dosing Weight: 70.3 kg   Vital Signs: Temp: 98.2 F (36.8 C) (02/11 0431) Temp Source: Oral (02/11 0431) BP: 120/79 (02/11 0745) Pulse Rate: 93 (02/11 1100)  Labs:  Recent Labs  03/23/16 0755  03/23/16 1238 03/23/16 1751 03/23/16 2355 03/24/16 0257  HGB 15.3  --   --   --   --  13.3  HCT 43.1  --   --   --   --  37.9*  PLT 390  --   --   --   --  313  HEPARINUNFRC  --   --   --   --   --  0.16*  CREATININE 1.16  --   --   --   --  0.85  TROPONINI 0.03*  < > 0.54* 8.45* 24.53*  --   < > = values in this interval not displayed.  Estimated Creatinine Clearance: 126.6 mL/min (by C-G formula based on SCr of 0.85 mg/dL).   Medical History: History reviewed. No pertinent past medical history.  Medications:  Scheduled:  . aspirin  81 mg Oral Daily  . atorvastatin  40 mg Oral q1800  . nicotine  21 mg Transdermal Daily  . pantoprazole (PROTONIX) IV  40 mg Intravenous Daily  . sodium chloride flush  3 mL Intravenous Q12H  . sodium chloride flush  3 mL Intravenous Q12H  . traZODone  100 mg Oral QHS    Assessment: Pharmacy consulted to dose heparin in this 46 year old male admitted with ACS/NSTEMI. Pt went to cath lab on 2/11 am and pharmacy is to re-initiate heparin drip 6 hours post sheath removal. Per RN, sheath was removed at 1010 so will begin heparin drip at 1600.  Goal of Therapy:  Heparin level 0.3-0.7 units/ml Monitor platelets by anticoagulation protocol: Yes   Plan:  Continue heparin drip at 1100 u/hr (prior rate) at 1600. Will check HL 6 hours after drip initiation.  Horris LatinoHolly Arliss Frisina, PharmD Pharmacy Resident 03/24/2016 11:24 AM

## 2016-03-24 NOTE — Progress Notes (Signed)
CH responded to a Non-Stemi PG.  Pt was to be taken to Cath lab but there was some confusion as to the nature of the PG. CH met with the Pt who presented anxious about the upcoming procedure, but who did not indicate a need for Spiritual Care. CH is available for follow up as needed.    03/24/16 0900  Clinical Encounter Type  Visited With Patient  Visit Type Initial;Spiritual support;Pre-op;Code (Non- Stemi)  Referral From Nurse

## 2016-03-24 NOTE — Progress Notes (Signed)
TR band off at this time. No complications noted at time; cap refill <2, radial pulse +3, no bleeding noted. [facility]. Pt transferred to room 252 without incident.

## 2016-03-24 NOTE — Progress Notes (Signed)
Pt had c/o abd discomfort early in shift. C/o nausea as well. Medicated with zofran, reglan, oxycodone and ketorlac as prescribed. Pt did receive relief and reported feeling better this am with abd discomfort being a 2. Pt c/o headache upon awakening this am 9/10 states wakes up with a headache every morning for past 15 days. oxycodone given. Pt also reported increased anxiety medicated with ativan x1

## 2016-03-24 NOTE — Progress Notes (Addendum)
ANTICOAGULATION CONSULT NOTE - Initial Consult  Pharmacy Consult for Heparin  Indication: chest pain/ACS  Allergies  Allergen Reactions  . Fluoxetine   . Haloperidol And Related     Patient Measurements: Height: 5\' 8"  (172.7 cm) Weight: 218 lb 7.6 oz (99.1 kg) IBW/kg (Calculated) : 68.4 Heparin Dosing Weight: 70.3 kg   Vital Signs: Temp: 99 F (37.2 C) (02/11 1959) Temp Source: Oral (02/11 1959) BP: 108/65 (02/11 1959) Pulse Rate: 81 (02/11 1959)  Labs:  Recent Labs  03/23/16 0755  03/23/16 1238 03/23/16 1751 03/23/16 2355 03/24/16 0257 03/24/16 2218  HGB 15.3  --   --   --   --  13.3  --   HCT 43.1  --   --   --   --  37.9*  --   PLT 390  --   --   --   --  313  --   HEPARINUNFRC  --   --   --   --   --  0.16* 0.12*  CREATININE 1.16  --   --   --   --  0.85  --   TROPONINI 0.03*  < > 0.54* 8.45* 24.53*  --   --   < > = values in this interval not displayed.  Estimated Creatinine Clearance: 126.6 mL/min (by C-G formula based on SCr of 0.85 mg/dL).   Medical History: History reviewed. No pertinent past medical history.  Medications:  Scheduled:  . aspirin  81 mg Oral Daily  . atorvastatin  40 mg Oral q1800  . nicotine  21 mg Transdermal Daily  . nicotine  21 mg Transdermal Once  . pantoprazole (PROTONIX) IV  40 mg Intravenous Daily  . sodium chloride flush  3 mL Intravenous Q12H  . sodium chloride flush  3 mL Intravenous Q12H  . traZODone  100 mg Oral QHS    Assessment: Pharmacy consulted to dose heparin in this 46 year old male admitted with ACS/NSTEMI. Pt went to cath lab on 2/11 am and pharmacy is to re-initiate heparin drip 6 hours post sheath removal. Per RN, sheath was removed at 1010 so will begin heparin drip at 1600.  Goal of Therapy:  Heparin level 0.3-0.7 units/ml Monitor platelets by anticoagulation protocol: Yes   Plan:  Continue heparin drip at 1100 u/hr (prior rate) at 1600. Will check HL 6 hours after drip initiation.  2/11 2200  heparin level 0.12. 2100 unit bolus and increase rate to 1350 units/hr. Recheck in 6 hours.  2/12 AM heparin level 0.75. Decrease to 1250 units/hr and recheck in 6 hours.  Horris LatinoHolly Gilliam, PharmD Pharmacy Resident 03/24/2016 11:35 PM

## 2016-03-24 NOTE — Consult Note (Signed)
Consultation  Referring Provider:     No ref. provider found Primary Care Physician:  No PCP Per Patient Primary Gastroenterologist: None Reason for Consultation: Abdominal pain  Date of Admission:  03/23/2016 Date of Consultation:  03/24/2016         HPI:   James Abbott is a 46 y.o. male smoker with no known medical history who presents for evaluation of abdominal pain.  The patient reports being in his USOH until 4am on the morning of 03/23/16 when he woke from sleep with acute infraumbilical abdominal pain. The pain was so severe that within an few hours he was on the floor crying and called 911. He was even unable to walk with the pain and had to crawl to the door when the paramedics arrived. He had some nausea on the way to the ER and then multiple episodes of emesis in the ER. He was also experiencing diaphoresis and palpitations in the ER. The abdominal pain radiated into the right side of his back at a few points.   He works 3 jobs that are all physically strenuous and takes care of his 43 year old daughter. He doesn't seem doctors in an outpatient setting due to insurance issues. He has cut down on smoking recently: he used to smoke 15-16 cigarettes a day for nearly 30 years, but is now down to 8 cigarettes daily. He does endorses prior cocaine use, but none in the past 2 years and 1 month.   Currently, the patient reports that his abdominal pain and nausea are very well controlled.   He denies chest pain, arm pain, jaw pain, evidence of GI bleeding, f/s/c, recent changes in weight or appetite, new or unusual food exposures, travel or sick contacts.  History reviewed. No pertinent past medical history.  History reviewed. No pertinent surgical history.  Prior to Admission medications   Not on File    History reviewed. No pertinent family history.  Family history of CAD Cousin that died of a brain tumor at age 30 An uncle with colon cancer No known relative with ovarian or  endometrial cancer  Social History  Substance Use Topics  . Smoking status: Former Games developer  . Smokeless tobacco: Never Used  . Alcohol use Not on file    Allergies as of 03/23/2016 - Review Complete 03/23/2016  Allergen Reaction Noted  . Fluoxetine  03/23/2016  . Haloperidol and related  03/23/2016    Review of Systems:    All systems reviewed and negative except where noted in HPI.   Physical Exam:  Vital signs in last 24 hours: Temp:  [98.2 F (36.8 C)-98.7 F (37.1 C)] 98.2 F (36.8 C) (02/11 0431) Pulse Rate:  [70-81] 74 (02/11 0431) Resp:  [16-20] 18 (02/11 0431) BP: (107-122)/(60-85) 107/60 (02/11 0431) SpO2:  [97 %-100 %] 99 % (02/11 0940) Weight:  [70.3 kg (155 lb)] 70.3 kg (155 lb) (02/10 1112)   General: Middle aged white male resting in a hospital bed comfortably Head: Normocephalic Eyes:  No icterus. Ears:  Normal auditory acuity. Oropharynx: MMM Lungs: Respirations even and unlabored. Lungs clear to auscultation bilaterally.   No wheezes, crackles, or rhonchi.  Heart:  Regular rate and rhythm, normal S1 and S2, without murmur, clicks, rubs or gallops Abdomen: +BS, soft, nondistended, mildly TTP in lower quadrants without r/g Rectal:  Not indicated Msk: Symmetrical without gross deformities. Extremities:  Warm without edema Neurologic:  Alert, grossly normal neurologically. Skin:  No visible lesions or rashes. Psych:  Alert, answering questions approrpiately  LAB RESULTS:  Recent Labs  03/23/16 0755 03/24/16 0257  WBC 25.5* 12.5*  HGB 15.3 13.3  HCT 43.1 37.9*  PLT 390 313   BMET  Recent Labs  03/23/16 0755 03/24/16 0257  NA 139 137  K 3.8 3.6  CL 104 108  CO2 24 24  GLUCOSE 170* 107*  BUN 18 18  CREATININE 1.16 0.85  CALCIUM 9.1 8.5*   LFT  Recent Labs  03/24/16 0257  PROT 6.2*  ALBUMIN 3.5  AST 103*  ALT 32  ALKPHOS 59  BILITOT 0.9   PT/INR No results for input(s): LABPROT, INR in the last 72 hours.  STUDIES: Ct Angio  Chest/abd/pel For Dissection W And/or Wo Contrast  Result Date: 03/23/2016 CLINICAL DATA:  Acute abdominal pain. EXAM: CT ANGIOGRAPHY CHEST, ABDOMEN AND PELVIS TECHNIQUE: Multidetector CT imaging through the chest, abdomen and pelvis was performed using the standard protocol during bolus administration of intravenous contrast. Multiplanar reconstructed images and MIPs were obtained and reviewed to evaluate the vascular anatomy. CONTRAST:  75 cc Isovue 370 IV COMPARISON:  12/21/2006 CT abdomen and pelvis FINDINGS: CTA CHEST FINDINGS Cardiovascular: Heart is normal size. Aorta is normal caliber. No evidence of dissection. Mediastinum/Nodes: No mediastinal, hilar, or axillary adenopathy. Lungs/Pleura: Lungs are clear. No focal airspace opacities or suspicious nodules. No effusions. Musculoskeletal: Chest wall soft tissues are unremarkable. No acute bony abnormality. Review of the MIP images confirms the above findings. CTA ABDOMEN AND PELVIS FINDINGS VASCULAR Aorta: Normal caliber. Mild atherosclerotic disease in the infrarenal aorta. No dissection. Celiac: Widely patent SMA: Widely patent Renals: 2 left and 1 right renal artery, widely patent IMA: Widely patent Inflow: Patent.  No dissection. Veins: Grossly unremarkable. Review of the MIP images confirms the above findings. NON-VASCULAR Hepatobiliary: No focal hepatic abnormality. Apparent pericholecystic fluid. No visible stones or biliary ductal dilatation. Pancreas: No focal abnormality or ductal dilatation. Spleen: No focal abnormality.  Normal size. Adrenals/Urinary Tract: No adrenal abnormality. No focal renal abnormality. No stones or hydronephrosis. Urinary bladder is unremarkable. Urinary bladder decompressed. Stomach/Bowel: Normal appendix. Stomach, large and small bowel grossly unremarkable. Lymphatic: No adenopathy Reproductive: No visible focal abnormality. Other:No free fluid or free air. Musculoskeletal: No acute bony abnormality. Review of the MIP  images confirms the above findings. IMPRESSION: No evidence of aortic aneurysm or dissection. No acute findings in the chest. Small amount of pericholecystic fluid of unknown etiology or significance. No visible stones. If felt clinically indicated, this could be further evaluated with right upper quadrant ultrasound. Electronically Signed   By: Charlett NoseKevin  Dover M.D.   On: 03/23/2016 08:25      Impression / Plan:   Tedd SiasDavid W Bennison is a 46 y.o. y/o male smoker with no known medical history who presents for evaluation of abdominal pain and found to have an NSTEMI. He is on systemic anticoagulation awaiting cardiac catheterization at this time. I suspect his abdominal pain is either a chest pain equivalent, although the location is unusual, or musculoskeletal in nature. At this time, I would treat his pain as a chest pain equivalent until he is re-vascularized. It is reassuring that CTA ruled out clots in his mesenteric vessels, which could also explain his abdominal pain.   - avoid narcotics for abdominal pain, treating it as a chest pain equivalent instead - can use heat packs for abdominal pain - manage NSTEMI - should he continue to have significant abdominal pain 48 hrs after cardiac re-vascularization, he will then require inpatient  versus outpatient GI consultation   Thank you for involving me in the care of this patient.     LOS: 1 day   Bluford Kaufmann, MD  03/24/2016, 10:52 AM

## 2016-03-24 NOTE — Interval H&P Note (Signed)
History and Physical Interval Note: The patient continued to have intermittent abdominal pain. Troponin gradually increased to 24. Echo showed severe periapical hypokinesis. Thus, urgent cath is recommended. Cath Lab Visit (complete for each Cath Lab visit)  Clinical Evaluation Leading to the Procedure:   ACS: Yes.    Non-ACS:  n/a    03/24/2016 9:23 AM  James Abbott  has presented today for surgery, with the diagnosis of Nstemi  The various methods of treatment have been discussed with the patient and family. After consideration of risks, benefits and other options for treatment, the patient has consented to  Procedure(s): Left Heart Cath and Coronary Angiography (N/A) as a surgical intervention .  The patient's history has been reviewed, patient examined, no change in status, stable for surgery.  I have reviewed the patient's chart and labs.  Questions were answered to the patient's satisfaction.     Lorine BearsMuhammad Shylo Zamor

## 2016-03-24 NOTE — Progress Notes (Signed)
[facility] [place] [facility]   PATIENT NAME: James Abbott    MR#:  [id number]  DATE OF BIRTH:  1970/12/18  SUBJECTIVE:  CHIEF COMPLAINT:   Chief Complaint  Patient presents with  . Abdominal Pain   - complains of Headache every morning for almost 2 weeks now. Complaints of insomnia, anxiety and stress issues. -Troponin has been elevated yesterday. Denies any chest pain. Started on heparin drip and for cardiac catheterization today  REVIEW OF SYSTEMS:  Review of Systems  Constitutional: Negative for chills, fever and malaise/fatigue.  HENT: Negative for congestion, ear discharge, hearing loss and nosebleeds.   Eyes: Negative for blurred vision and double vision.  Respiratory: Negative for cough, shortness of breath and wheezing.   Cardiovascular: Negative for chest pain, palpitations and leg swelling.  Gastrointestinal: Positive for abdominal pain. Negative for constipation, diarrhea, nausea and vomiting.  Genitourinary: Negative for dysuria.  Neurological: Positive for headaches. Negative for dizziness, sensory change, speech change, focal weakness and seizures.  Psychiatric/Behavioral: Negative for substance abuse and suicidal ideas. The patient is nervous/anxious and has insomnia.     DRUG ALLERGIES:   Allergies  Allergen Reactions  . Fluoxetine   . Haloperidol And Related     VITALS:  Blood pressure 107/60, pulse 74, temperature 98.2 F (36.8 C), temperature source Oral, resp. rate 18, height 5\' 8"  (1.727 m), weight 70.3 kg (155 lb), SpO2 98 %.  PHYSICAL EXAMINATION:  Physical Exam  GENERAL:  46 y.o.-year-old patient lying in the bed with no acute distress. Anxious appearing EYES: Pupils equal, round, reactive to light and accommodation. No scleral icterus. Extraocular muscles intact.  HEENT: Head atraumatic, normocephalic. Oropharynx and nasopharynx clear.  NECK:  Supple, no jugular venous distention. No thyroid enlargement, no tenderness.    LUNGS: Normal breath sounds bilaterally, no wheezing, rales,rhonchi or crepitation. No use of accessory muscles of respiration.  CARDIOVASCULAR: S1, S2 normal. No murmurs, rubs, or gallops.  ABDOMEN: Soft, nontender, nondistended. Bowel sounds present. No organomegaly or mass.  EXTREMITIES: No pedal edema, cyanosis, or clubbing.  NEUROLOGIC: Cranial nerves II through XII are intact. Muscle strength 5/5 in all extremities. Sensation intact. Gait not checked.  PSYCHIATRIC: The patient is alert and oriented x 3. Very anxious and starts having tremors/myoclonic jerks of his lower extremities SKIN: No obvious rash, lesion, or ulcer.    LABORATORY PANEL:   CBC  Recent Labs Lab 03/24/16 0257  WBC 12.5*  HGB 13.3  HCT 37.9*  PLT 313   ------------------------------------------------------------------------------------------------------------------  Chemistries   Recent Labs Lab 03/24/16 0257  NA 137  K 3.6  CL 108  CO2 24  GLUCOSE 107*  BUN 18  CREATININE 0.85  CALCIUM 8.5*  AST 103*  ALT 32  ALKPHOS 59  BILITOT 0.9   ------------------------------------------------------------------------------------------------------------------  Cardiac Enzymes  Recent Labs Lab 03/23/16 2355  TROPONINI 24.53*   ------------------------------------------------------------------------------------------------------------------  RADIOLOGY:  Ct Angio Chest/abd/pel For Dissection W And/or Wo Contrast  Result Date: 03/23/2016 CLINICAL DATA:  Acute abdominal pain. EXAM: CT ANGIOGRAPHY CHEST, ABDOMEN AND PELVIS TECHNIQUE: Multidetector CT imaging through the chest, abdomen and pelvis was performed using the standard protocol during bolus administration of intravenous contrast. Multiplanar reconstructed images and MIPs were obtained and reviewed to evaluate the vascular anatomy. CONTRAST:  75 cc Isovue 370 IV COMPARISON:  12/21/2006 CT abdomen and pelvis FINDINGS: CTA CHEST FINDINGS  Cardiovascular: Heart is normal size. Aorta is normal caliber. No evidence of dissection. Mediastinum/Nodes: No mediastinal, hilar, or axillary adenopathy. Lungs/Pleura:  Lungs are clear. No focal airspace opacities or suspicious nodules. No effusions. Musculoskeletal: Chest wall soft tissues are unremarkable. No acute bony abnormality. Review of the MIP images confirms the above findings. CTA ABDOMEN AND PELVIS FINDINGS VASCULAR Aorta: Normal caliber. Mild atherosclerotic disease in the infrarenal aorta. No dissection. Celiac: Widely patent SMA: Widely patent Renals: 2 left and 1 right renal artery, widely patent IMA: Widely patent Inflow: Patent.  No dissection. Veins: Grossly unremarkable. Review of the MIP images confirms the above findings. NON-VASCULAR Hepatobiliary: No focal hepatic abnormality. Apparent pericholecystic fluid. No visible stones or biliary ductal dilatation. Pancreas: No focal abnormality or ductal dilatation. Spleen: No focal abnormality.  Normal size. Adrenals/Urinary Tract: No adrenal abnormality. No focal renal abnormality. No stones or hydronephrosis. Urinary bladder is unremarkable. Urinary bladder decompressed. Stomach/Bowel: Normal appendix. Stomach, large and small bowel grossly unremarkable. Lymphatic: No adenopathy Reproductive: No visible focal abnormality. Other:No free fluid or free air. Musculoskeletal: No acute bony abnormality. Review of the MIP images confirms the above findings. IMPRESSION: No evidence of aortic aneurysm or dissection. No acute findings in the chest. Small amount of pericholecystic fluid of unknown etiology or significance. No visible stones. If felt clinically indicated, this could be further evaluated with right upper quadrant ultrasound. Electronically Signed   By: Charlett Nose M.D.   On: 03/23/2016 08:25    EKG:   Orders placed or performed during the hospital encounter of 03/23/16  . EKG 12-Lead  . EKG 12-Lead  . EKG 12-Lead  . EKG 12-Lead  .  EKG 12-Lead  . EKG 12-Lead  . ED EKG  . ED EKG  . ED EKG  . ED EKG  . EKG 12-Lead  . EKG 12-Lead  . EKG 12-Lead  . EKG 12-Lead    ASSESSMENT AND PLAN:   46 year old male with past medical history significant for PTSD not on any medications for 2 years now, depression and anxiety presents to Hospital complaining of abdominal pain. Also noted to have EKG changes.  #1 NSTEMI- denies any chest pain. However EKG changes with borderline ST elevation in inferior leads. -Appreciate cardiology input. For cardiac catheterization this morning. -Started on heparin drip. An aspirin and statin. -Check A1c and lipid panel. If blood pressure is elevated, will add beta blockers and ACEI  #2 abdominal pain-CT of the chest and abdomen is completely normal except for minimal cholecystic fluid. Patient's pain is more in the periumbilical region. Appears musculoskeletal in nature at this time. Continue as needed pain medications -No evidence of any sepsis noted. Discontinue vancomycin and Zosyn. Follow blood cultures  #3 headaches-secondary to stress and also insomnia. -Pain medications as needed.  #4 PTSD-has not been taking medications for 2 years now. Very anxious. Will need psych evaluation after cardiac is stable -Started on trazodone for insomnia at this time.  #5 DVT prophylaxis-on heparin drip  #6 tobacco use disorder-started on nicotine patch     All the records are reviewed and case discussed with Care Management/Social Workerr. Management plans discussed with the patient, family and they are in agreement.  CODE STATUS: Full code  TOTAL TIME TAKING CARE OF THIS PATIENT: 37 minutes.   POSSIBLE D/C IN 1-2 DAYS, DEPENDING ON CLINICAL CONDITION.   Enid Baas M.D on 03/24/2016 at 8:16 AM  Between 7am to 6pm - Pager - [phone]  After 6pm go to [email or web address] - Social research officer, government  [facility]  Office  [phone]  CC: Primary care physician; No PCP Per  Patient

## 2016-03-24 NOTE — Progress Notes (Signed)
Per Dr. Kennith MaesArrida, pt can return to room 252 once TR band is off.

## 2016-03-24 NOTE — H&P (View-Only) (Signed)
 CARDIOLOGY CONSULT NOTE  Patient ID: James Abbott MRN: [id number] DOB/AGE: 06/11/1970 45 y.o.  Admit date: 03/23/2016 Referring Physician:  Dr. Paduchowski Primary Physician: No PCP Per Patient Primary Cardiologist : New Reason for Consultation : abnormal ECG  Date:  03/23/2016    Chief Complaint  Patient presents with  . Abdominal Pain      History of Present Illness: James Abbott is a 45 y.o. male with no previous cardiac history and no significant chronic medical conditions other than tobacco use and previous cocaine use. He presented to the emergency room with acute onset lower abdominal pain which started around 4:00 this morning and has been continuous since then. The pain is severe and described as aching with no radiation. He has no epigastric pain or chest discomfort at all. He appears uncomfortable. No significant shortness of breath. He denies change in bowel movements and no nausea or vomiting. The patient's EKG was abnormal with borderline ST elevation in the inferior leads as well as V4 to V6. The morphology is suggestive of early repolarization and not consistent with ST elevation myocardial infarction. First troponin came back borderline at 0.03. So far his workup has been unremarkable except for white cell count of 25,000. CT abdomen was overall unremarkable except for small amount of pericholecystic fluid.    History reviewed. No pertinent past medical history.  History reviewed. No pertinent surgical history.   Current Facility-Administered Medications  Medication Dose Route Frequency Provider Last Rate Last Dose  . HYDROmorphone (DILAUDID) 1 MG/ML injection            No current outpatient prescriptions on file.    Allergies:   Patient has no allergy information on record.    Social History:  The patient Is remarkable for smoking a few cigarettes a day. 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 He drinks alcohol  occasionally but not on a regular basis.  Family History:  The patient's family history is negative for premature coronary artery disease.   ROS:  Please see the history of present illness.   Otherwise, review of systems are positive for none.   All other systems are reviewed and negative.    PHYSICAL EXAM: VS:  BP 121/67   Pulse 77   Resp (!) 23   SpO2 100%  , BMI There is no height or weight on file to calculate BMI. GEN: Well nourished, well developed, in no acute distress  HEENT: normal  Neck: no JVD, carotid bruits, or masses Cardiac: RRR; no murmurs, rubs, or gallops,no edema  Respiratory:  clear to auscultation bilaterally, normal work of breathing GI: soft, nontender, nondistended, + BS MS: no deformity or atrophy  Skin: warm and dry, no rash Neuro:  Strength and sensation are intact Psych: euthymic mood, full affect   EKG:   Personally reviewed by me and showed: Normal sinus rhythm with borderline inferior ST elevation with no reciprocal changes. Morphology is suggestive of early repolarization.  Recent Labs: 03/23/2016: ALT 25; BUN 18; Creatinine, Ser 1.16; Hemoglobin 15.3; Platelets 390; Potassium 3.8; Sodium 139    Lipid Panel No results found for: CHOL, TRIG, HDL, CHOLHDL, VLDL, LDLCALC, LDLDIRECT    Wt Readings from Last 3 Encounters:  No data found for Wt        ASSESSMENT AND PLAN:  1.  Abdominal pain with leukocytosis: No clear etiology. The pain is in the lower abdomen and this would be a very   unusual location for myocardial infarction. I agree with continued workup for other etiologies.  2. Abnormal EKG suggestive of possible inferior ST elevation myocardial infarction: However, the EKG does not fully meet the criteria and the presentation is very atypical. First troponin was borderline at 0.03. I recommend repeat troponin now to see the trend. If there is a change in troponin, I recommend proceeding with urgent cardiac catheterization. If however there  is no change, I recommend an echocardiogram and completing workup for abdominal pain.   Signed,  Muhammad Arida, MD  03/23/2016 9:55 AM    [facility]  

## 2016-03-25 ENCOUNTER — Encounter: Payer: Self-pay | Admitting: Cardiovascular Disease

## 2016-03-25 DIAGNOSIS — Z9119 Patient's noncompliance with other medical treatment and regimen: Secondary | ICD-10-CM

## 2016-03-25 DIAGNOSIS — F1411 Cocaine abuse, in remission: Secondary | ICD-10-CM

## 2016-03-25 DIAGNOSIS — F4310 Post-traumatic stress disorder, unspecified: Secondary | ICD-10-CM

## 2016-03-25 DIAGNOSIS — Z91199 Patient's noncompliance with other medical treatment and regimen due to unspecified reason: Secondary | ICD-10-CM

## 2016-03-25 DIAGNOSIS — F1210 Cannabis abuse, uncomplicated: Secondary | ICD-10-CM

## 2016-03-25 LAB — HIV ANTIBODY (ROUTINE TESTING W REFLEX): HIV Screen 4th Generation wRfx: NONREACTIVE

## 2016-03-25 LAB — BASIC METABOLIC PANEL
ANION GAP: 5 (ref 5–15)
BUN: 11 mg/dL (ref 6–20)
CALCIUM: 8.7 mg/dL — AB (ref 8.9–10.3)
CO2: 26 mmol/L (ref 22–32)
Chloride: 106 mmol/L (ref 101–111)
Creatinine, Ser: 0.72 mg/dL (ref 0.61–1.24)
GFR calc Af Amer: >60 mL/min (ref >60)
GLUCOSE: 97 mg/dL (ref 65–99)
Potassium: 3.7 mmol/L (ref 3.5–5.1)
SODIUM: 137 mmol/L (ref 135–145)

## 2016-03-25 LAB — CBC
HCT: 37.2 % — ABNORMAL LOW (ref 40.0–52.0)
Hemoglobin: 13.3 g/dL (ref 13.0–18.0)
MCH: 31.3 pg (ref 26.0–34.0)
MCHC: 35.8 g/dL (ref 32.0–36.0)
MCV: 87.6 fL (ref 80.0–100.0)
PLATELETS: 300 10*3/uL (ref 150–440)
RBC: 4.25 MIL/uL — AB (ref 4.40–5.90)
RDW: 13.3 % (ref 11.5–14.5)
WBC: 9.3 10*3/uL (ref 3.8–10.6)

## 2016-03-25 LAB — URINE CULTURE: CULTURE: NO GROWTH

## 2016-03-25 LAB — HOMOCYSTEINE: Homocysteine: 7.4 umol/L (ref 0.0–15.0)

## 2016-03-25 LAB — HEMOGLOBIN A1C
Hgb A1c MFr Bld: 5.4 % (ref 4.8–5.6)
MEAN PLASMA GLUCOSE: 108 mg/dL

## 2016-03-25 LAB — HEPARIN LEVEL (UNFRACTIONATED): HEPARIN UNFRACTIONATED: 0.75 [IU]/mL — AB (ref 0.30–0.70)

## 2016-03-25 MED ORDER — METOPROLOL TARTRATE 25 MG PO TABS
12.5000 mg | ORAL_TABLET | Freq: Two times a day (BID) | ORAL | Status: DC
  Administered 2016-03-25: 12.5 mg via ORAL
  Filled 2016-03-25: qty 1

## 2016-03-25 MED ORDER — APIXABAN 5 MG PO TABS
10.0000 mg | ORAL_TABLET | Freq: Two times a day (BID) | ORAL | Status: DC
  Administered 2016-03-25: 10 mg via ORAL
  Filled 2016-03-25: qty 2

## 2016-03-25 MED ORDER — APIXABAN 5 MG PO TABS: 10.0000 mg | ORAL_TABLET | Freq: Two times a day (BID) | ORAL | 0 refills | Status: DC

## 2016-03-25 MED ORDER — TRAZODONE HCL 100 MG PO TABS: 100.0000 mg | ORAL_TABLET | Freq: Every day | ORAL | 2 refills | Status: DC

## 2016-03-25 MED ORDER — METOPROLOL TARTRATE 25 MG PO TABS: 12.5000 mg | ORAL_TABLET | Freq: Two times a day (BID) | ORAL | 2 refills | Status: DC

## 2016-03-25 MED ORDER — APIXABAN 5 MG PO TABS: 5.0000 mg | ORAL_TABLET | Freq: Two times a day (BID) | ORAL | Status: DC

## 2016-03-25 MED ORDER — ATORVASTATIN CALCIUM 40 MG PO TABS: 40.0000 mg | ORAL_TABLET | Freq: Every day | ORAL | 2 refills | Status: AC

## 2016-03-25 NOTE — Consult Note (Signed)
[facility] Consult   Reason for Consult:  Consult for 46 year old man with history of substance abuse related problems who is complaining of anxiety Referring Physician:  Tressia Miners Patient Identification: James Abbott MRN:  [id number] Principal Diagnosis: PTSD (post-traumatic stress disorder) Diagnosis:   Patient Active Problem List   Diagnosis Date Noted  . PTSD (post-traumatic stress disorder) [F43.10] 03/25/2016  . Marijuana abuse [F12.10] 03/25/2016  . Noncompliance [Z91.19] 03/25/2016  . History of cocaine abuse [Z87.898] 03/25/2016  . Non-ST elevation (NSTEMI) myocardial infarction ([place]) [I21.4]   . Sepsis ([place]) [A41.9] 03/23/2016    Total Time spent with patient: 1 hour  Subjective:   James Abbott is a 46 y.o. male patient admitted with "I'm feeling pretty stressed out".  HPI:  Consult requested because of anxiety. Patient interviewed. Chart reviewed. Spoke with hospitalist and case management. Reviewed old psychiatric notes. Patient came into the hospital with abdominal pain and was diagnosed with a non-ST elevated MI. He is likely to be discharged within the next day or so. Patient is complaining that he has posttraumatic stress disorder and chronic panic attacks. He says he has panic attacks all the time although he doesn't really distinguish them from what he says is in almost constant feeling of anxiety. Says his mood feels nervous all the time and he has difficulty being around people. He sleeps poorly at night saying he only gets about 3 hours of sleep. Appetite is supposedly poor. Denies any current suicidal ideation but says that he doesn't have much that he enjoys in his life and feels hopeless a lot. Patient repeats that he believes he has a diagnosis of PTSD related to traumatic events in his childhood and of panic attacks although the anxiety he describes don't necessarily fit with a panic-like description. Patient denies that he has been using any kind of  abusable drugs in several years. He denies that he is currently receiving any outpatient psychiatric treatment. He says that he was last going for psychiatric treatment 8 or 9 years ago. Also claims that it's been about that long since he was in a psychiatric hospital. He denies alcohol abuse.  Medical history: Non-ST elevated MI this time he is in the hospital.  Social history: Lives with his elderly mother and his 35-year-old daughter. Does manual labor. Hasn't been able to work much this month because of the weather. Doesn't have any insurance or financial coverage for health care.  Substance abuse history: Patient minimizes or denies all of this. He says that he last used cocaine 8 or 9 years ago. It is documented in the chart that he had a cocaine related disorder 2 years ago. Denies that he's been using any other drugs recently although he had marijuana in his drug screen on admission.  Past Psychiatric History: Patient says he's had suicidal thoughts in the past but has never actually tried to kill himself. He says that he has been prescribed "everything" for possible treatment of his anxiety disorder. He lists multiple antipsychotics, multiple antidepressants, but says that only benzodiazepines were of any help. He says the only thing that will help him is Xanax or Valium or possibly Klonopin. Claims that he's had bad reactions to multiple other medicines. He claims also that therapy can't  possibly help him.  Risk to Self: Is patient at risk for suicide?: No Risk to Others:   Prior Inpatient Therapy:  positive we have a documented that he was at [facility] 2 years ago Prior Outpatient Therapy:  unclear if he ever has actually followed up anywhere. He was unfamiliar with the outpatient services that have been in place in [facility] for over 10 years  Past Medical History: History reviewed. No pertinent past medical history.  Past Surgical History:  Procedure Laterality Date  . LEFT HEART CATH  AND CORONARY ANGIOGRAPHY N/A 03/24/2016   Procedure: Left Heart Cath and Coronary Angiography;  Surgeon: Wellington Hampshire, MD;  Location: [facility];  Service: Cardiovascular;  Laterality: N/A;   Family History: History reviewed. No pertinent family history. Family Psychiatric  History: He says he had a third cousin who committed suicide and several other family members with anxiety Social History:  History  Alcohol use Not on file     History  Drug use: Unknown    Social History   Social History  . Marital status: Single    Spouse name: N/A  . Number of children: N/A  . Years of education: N/A   Social History Main Topics  . Smoking status: Former Research scientist (life sciences)  . Smokeless tobacco: Never Used  . Alcohol use None  . Drug use: Unknown  . Sexual activity: Not Asked   Other Topics Concern  . None   Social History Narrative  . None   Additional Social History:    Allergies:   Allergies  Allergen Reactions  . Fluoxetine   . Haloperidol And Related     Labs:  Results for orders placed or performed during the hospital encounter of 03/23/16 (from the past 48 hour(s))  Lactic acid, plasma     Status: Abnormal   Collection Time: 03/23/16 12:38 PM  Result Value Ref Range   Lactic Acid, Venous 2.1 (HH) 0.5 - 1.9 mmol/L    Comment: CRITICAL RESULT CALLED TO, READ BACK BY AND VERIFIED WITH ASHLEY WILLIAMS 03/23/16 1331 SGD   Troponin I     Status: Abnormal   Collection Time: 03/23/16 12:38 PM  Result Value Ref Range   Troponin I 0.54 (HH) <0.03 ng/mL    Comment: CRITICAL VALUE NOTED. VALUE IS CONSISTENT WITH PREVIOUSLY REPORTED/CALLED VALUE.SGD  Troponin I     Status: Abnormal   Collection Time: 03/23/16  5:51 PM  Result Value Ref Range   Troponin I 8.45 (HH) <0.03 ng/mL    Comment: CRITICAL RESULT CALLED TO, READ BACK BY AND VERIFIED WITH ASHLEY WILLIAMS AT 1842 03/23/16.PMH  Troponin I     Status: Abnormal   Collection Time: 03/23/16 11:55 PM  Result Value Ref  Range   Troponin I 24.53 (HH) <0.03 ng/mL    Comment: CRITICAL VALUE NOTED. VALUE IS CONSISTENT WITH PREVIOUSLY REPORTED/CALLED VALUE.PMH  TSH     Status: None   Collection Time: 03/24/16  2:57 AM  Result Value Ref Range   TSH 0.918 0.350 - 4.500 uIU/mL    Comment: Performed by a 3rd Generation assay with a functional sensitivity of <=0.01 uIU/mL.  Comprehensive metabolic panel     Status: Abnormal   Collection Time: 03/24/16  2:57 AM  Result Value Ref Range   Sodium 137 135 - 145 mmol/L   Potassium 3.6 3.5 - 5.1 mmol/L   Chloride 108 101 - 111 mmol/L   CO2 24 22 - 32 mmol/L   Glucose, Bld 107 (H) 65 - 99 mg/dL   BUN 18 6 - 20 mg/dL   Creatinine, Ser 0.85 0.61 - 1.24 mg/dL   Calcium 8.5 (L) 8.9 - 10.3 mg/dL   Total Protein 6.2 (L) 6.5 - 8.1 g/dL  Albumin 3.5 3.5 - 5.0 g/dL   AST 103 (H) 15 - 41 U/L   ALT 32 17 - 63 U/L   Alkaline Phosphatase 59 38 - 126 U/L   Total Bilirubin 0.9 0.3 - 1.2 mg/dL   GFR calc non Af Amer >60 >60 mL/min   GFR calc Af Amer >60 >60 mL/min    Comment: (NOTE) The eGFR has been calculated using the CKD EPI equation. This calculation has not been validated in all clinical situations. eGFR's persistently <60 mL/min signify possible Chronic Kidney Disease.    Anion gap 5 5 - 15  CBC     Status: Abnormal   Collection Time: 03/24/16  2:57 AM  Result Value Ref Range   WBC 12.5 (H) 3.8 - 10.6 K/uL   RBC 4.31 (L) 4.40 - 5.90 MIL/uL   Hemoglobin 13.3 13.0 - 18.0 g/dL   HCT 37.9 (L) 40.0 - 52.0 %   MCV 87.9 80.0 - 100.0 fL   MCH 30.9 26.0 - 34.0 pg   MCHC 35.1 32.0 - 36.0 g/dL   RDW 13.3 11.5 - 14.5 %   Platelets 313 150 - 440 K/uL  Heparin level (unfractionated)     Status: Abnormal   Collection Time: 03/24/16  2:57 AM  Result Value Ref Range   Heparin Unfractionated 0.16 (L) 0.30 - 0.70 IU/mL    Comment:        IF HEPARIN RESULTS ARE BELOW EXPECTED VALUES, AND PATIENT DOSAGE HAS BEEN CONFIRMED, SUGGEST FOLLOW UP TESTING OF ANTITHROMBIN III  LEVELS.   Glucose, capillary     Status: Abnormal   Collection Time: 03/24/16 10:32 AM  Result Value Ref Range   Glucose-Capillary 111 (H) 65 - 99 mg/dL  Lipid panel     Status: Abnormal   Collection Time: 03/24/16 10:48 AM  Result Value Ref Range   Cholesterol 181 0 - 200 mg/dL   Triglycerides 85 <150 mg/dL   HDL 51 >40 mg/dL   Total CHOL/HDL Ratio 3.5 RATIO   VLDL 17 0 - 40 mg/dL   LDL Cholesterol 113 (H) 0 - 99 mg/dL    Comment:        Total Cholesterol/HDL:CHD Risk Coronary Heart Disease Risk Table                     Men   Women  1/2 Average Risk   3.4   3.3  Average Risk       5.0   4.4  2 X Average Risk   9.6   7.1  3 X Average Risk  23.4   11.0        Use the calculated Patient Ratio above and the CHD Risk Table to determine the patient's CHD Risk.        ATP III CLASSIFICATION (LDL):  <100     mg/dL   Optimal  100-129  mg/dL   Near or Above                    Optimal  130-159  mg/dL   Borderline  160-189  mg/dL   High  >190     mg/dL   Very High   Hemoglobin A1c     Status: None   Collection Time: 03/24/16 10:48 AM  Result Value Ref Range   Hgb A1c MFr Bld 5.4 4.8 - 5.6 %    Comment: (NOTE)         Pre-diabetes: 5.7 - 6.4  Diabetes: >6.4         Glycemic control for adults with diabetes: <7.0    Mean Plasma Glucose 108 mg/dL    Comment: (NOTE) Performed At: [facility] [place], [place] [id number] Lindon Romp MD [id number]   Lipase, blood     Status: None   Collection Time: 03/24/16 10:48 AM  Result Value Ref Range   Lipase 46 11 - 51 U/L  Heparin level (unfractionated)     Status: Abnormal   Collection Time: 03/24/16 10:18 PM  Result Value Ref Range   Heparin Unfractionated 0.12 (L) 0.30 - 0.70 IU/mL    Comment:        IF HEPARIN RESULTS ARE BELOW EXPECTED VALUES, AND PATIENT DOSAGE HAS BEEN CONFIRMED, SUGGEST FOLLOW UP TESTING OF ANTITHROMBIN III LEVELS.   Basic metabolic panel     Status: Abnormal    Collection Time: 03/25/16  6:02 AM  Result Value Ref Range   Sodium 137 135 - 145 mmol/L   Potassium 3.7 3.5 - 5.1 mmol/L   Chloride 106 101 - 111 mmol/L   CO2 26 22 - 32 mmol/L   Glucose, Bld 97 65 - 99 mg/dL   BUN 11 6 - 20 mg/dL   Creatinine, Ser 0.72 0.61 - 1.24 mg/dL   Calcium 8.7 (L) 8.9 - 10.3 mg/dL   GFR calc non Af Amer >60 >60 mL/min   GFR calc Af Amer >60 >60 mL/min    Comment: (NOTE) The eGFR has been calculated using the CKD EPI equation. This calculation has not been validated in all clinical situations. eGFR's persistently <60 mL/min signify possible Chronic Kidney Disease.    Anion gap 5 5 - 15  Heparin level (unfractionated)     Status: Abnormal   Collection Time: 03/25/16  6:02 AM  Result Value Ref Range   Heparin Unfractionated 0.75 (H) 0.30 - 0.70 IU/mL    Comment:        IF HEPARIN RESULTS ARE BELOW EXPECTED VALUES, AND PATIENT DOSAGE HAS BEEN CONFIRMED, SUGGEST FOLLOW UP TESTING OF ANTITHROMBIN III LEVELS.   CBC     Status: Abnormal   Collection Time: 03/25/16  6:02 AM  Result Value Ref Range   WBC 9.3 3.8 - 10.6 K/uL   RBC 4.25 (L) 4.40 - 5.90 MIL/uL   Hemoglobin 13.3 13.0 - 18.0 g/dL   HCT 37.2 (L) 40.0 - 52.0 %   MCV 87.6 80.0 - 100.0 fL   MCH 31.3 26.0 - 34.0 pg   MCHC 35.8 32.0 - 36.0 g/dL   RDW 13.3 11.5 - 14.5 %   Platelets 300 150 - 440 K/uL    Current Facility-Administered Medications  Medication Dose Route Frequency Provider Last Rate Last Dose  . 0.9 %  sodium chloride infusion  250 mL Intravenous PRN Wellington Hampshire, MD      . acetaminophen (TYLENOL) tablet 650 mg  650 mg Oral Q6H PRN Nicholes Mango, MD       Or  . acetaminophen (TYLENOL) suppository 650 mg  650 mg Rectal Q6H PRN Nicholes Mango, MD      . apixaban (ELIQUIS) tablet 10 mg  10 mg Oral BID Vira Blanco, RPH   10 mg at 03/25/16 1123  . [START ON 04/01/2016] apixaban (ELIQUIS) tablet 5 mg  5 mg Oral BID Vira Blanco, [facility]      . atorvastatin (LIPITOR) tablet 40 mg  40 mg Oral  q1800 Wellington Hampshire, MD  40 mg at 03/24/16 1630  . ketorolac (TORADOL) 15 MG/ML injection 15 mg  15 mg Intravenous Q6H PRN Nicholes Mango, MD   15 mg at 03/25/16 0904  . LORazepam (ATIVAN) injection 1 mg  1 mg Intravenous Q6H PRN Gladstone Lighter, MD   1 mg at 03/25/16 0904  . metoCLOPramide (REGLAN) injection 5 mg  5 mg Intravenous Q6H PRN Nicholes Mango, MD   5 mg at 03/23/16 2200  . metoprolol tartrate (LOPRESSOR) tablet 12.5 mg  12.5 mg Oral BID Areta Haber Dunn, PA-C   12.5 mg at 03/25/16 1123  . nicotine (NICODERM CQ - dosed in mg/24 hours) patch 21 mg  21 mg Transdermal Daily Minna Merritts, MD   21 mg at 03/25/16 0912  . nicotine (NICODERM CQ - dosed in mg/24 hours) patch 21 mg  21 mg Transdermal Once Wellington Hampshire, MD   21 mg at 03/24/16 2059  . ondansetron (ZOFRAN) tablet 4 mg  4 mg Oral Q6H PRN Nicholes Mango, MD       Or  . ondansetron (ZOFRAN) injection 4 mg  4 mg Intravenous Q6H PRN Nicholes Mango, MD   4 mg at 03/24/16 1049  . oxyCODONE (Oxy IR/ROXICODONE) immediate release tablet 5 mg  5 mg Oral Q4H PRN Nicholes Mango, MD   5 mg at 03/25/16 0501  . pantoprazole (PROTONIX) injection 40 mg  40 mg Intravenous Daily Nicholes Mango, MD   40 mg at 03/25/16 0903  . sodium chloride flush (NS) 0.9 % injection 3 mL  3 mL Intravenous Q12H Nicholes Mango, MD   3 mL at 03/25/16 0913  . sodium chloride flush (NS) 0.9 % injection 3 mL  3 mL Intravenous Q12H Wellington Hampshire, MD   3 mL at 03/25/16 0913  . sodium chloride flush (NS) 0.9 % injection 3 mL  3 mL Intravenous PRN Wellington Hampshire, MD      . traMADol (ULTRAM) tablet 50 mg  50 mg Oral Q6H PRN Gladstone Lighter, MD      . traZODone (DESYREL) tablet 100 mg  100 mg Oral QHS Gladstone Lighter, MD   100 mg at 03/24/16 2238    Musculoskeletal: Strength & Muscle Tone: within normal limits Gait & Station: normal Patient leans: N/A  Psychiatric Specialty Exam: Physical Exam  Nursing note and vitals reviewed. Constitutional: He appears well-developed and  well-nourished.  HENT:  Head: Normocephalic and atraumatic.  Eyes: Conjunctivae are normal. Pupils are equal, round, and reactive to light.  Neck: Normal range of motion.  Cardiovascular: Regular rhythm and normal heart sounds.   Respiratory: Effort normal. No respiratory distress.  GI: Soft.  Musculoskeletal: Normal range of motion.  Neurological: He is alert.  Skin: Skin is warm and dry.  Psychiatric: His speech is normal. His mood appears not anxious. His affect is blunt. His affect is not angry. He is slowed. He is not agitated, not aggressive, not hyperactive and not combative. Thought content is not paranoid and not delusional. He expresses inappropriate judgment. He does not exhibit a depressed mood. He expresses no homicidal and no suicidal ideation. He exhibits abnormal recent memory and abnormal remote memory.    Review of Systems  Constitutional: Negative.   HENT: Negative.   Eyes: Negative.   Respiratory: Negative.   Cardiovascular: Negative.   Gastrointestinal: Negative.   Musculoskeletal: Negative.   Skin: Negative.   Neurological: Negative.   Psychiatric/Behavioral: Negative for depression, hallucinations, memory loss, substance abuse and suicidal ideas. The patient is nervous/anxious  and has insomnia.     Blood pressure (!) 119/57, pulse (!) 102, temperature 98.2 F (36.8 C), temperature source Oral, resp. rate 19, height 5' 8"  (1.727 m), weight 99.1 kg (218 lb 7.6 oz), SpO2 95 %.Body mass index is 33.22 kg/m.  General Appearance: Fairly Groomed  Eye Contact:  Good  Speech:  Slow  Volume:  Decreased  Mood:  Dysphoric  Affect:  Constricted  Thought Process:  Goal Directed  Orientation:  Full (Time, Place, and Person)  Thought Content:  Logical  Suicidal Thoughts:  No  Homicidal Thoughts:  No  Memory:  Immediate;   Good Recent;   Fair Remote;   Poor  Judgement:  Impaired  Insight:  Shallow  Psychomotor Activity:  Decreased  Concentration:  Concentration:  Fair  Recall:  [employer] of Knowledge:  Fair  Language:  Fair  Akathisia:  No  Handed:  Right  AIMS (if indicated):     Assets:  Communication Skills Desire for Improvement Housing  ADL's:  Intact  Cognition:  WNL  Sleep:        Treatment Plan Summary: Plan 46 year old man who is complaining of chronic anxiety. I reviewed with him the appropriate treatments for posttraumatic stress disorder and panic disorder. In both cases the primary recommended treatment involves psychotherapy. Patient out of hand dismiss this as being unhelpful. I reviewed with him that serotonin reuptake inhibitors are the medications of choice and he again dismissed this out of hand as being completely unhelpful. He insisted that nothing but benzodiazepines would help with his condition. Benzodiazepines are unlikely to be a long-term solution to panic disorder and are actually contraindicated in posttraumatic stress disorder. I suspect the patient is primarily just focusing on getting controlled substances either for abuse himself or for redistribution. There is no indication to start any of these medicines. I encouraged him to consider following up with the local available mental health resources. Case reviewed with hospitalist and case management. He should be referred to the [facility].  Disposition: Patient does not meet criteria for psychiatric inpatient admission. Supportive therapy provided about ongoing stressors.  Alethia Berthold, MD 03/25/2016 12:27 PM

## 2016-03-25 NOTE — Discharge Summary (Signed)
[employer] [facility] [place] [facility]   PATIENT NAME: James Abbott    MR#:  [id number]  DATE OF BIRTH:  1970/09/17  DATE OF ADMISSION:  03/23/2016   ADMITTING PHYSICIAN: Ramonita LabAruna Gouru, MD  DATE OF DISCHARGE: 03/25/2016 12:47 PM  PRIMARY CARE PHYSICIAN: No PCP Per Patient   ADMISSION DIAGNOSIS:   Periumbilical abdominal pain [R10.33]  DISCHARGE DIAGNOSIS:   Principal Problem:   PTSD (post-traumatic stress disorder) Active Problems:   Sepsis (HCC)   Non-ST elevation (NSTEMI) myocardial infarction (HCC)   Marijuana abuse   Noncompliance   History of cocaine abuse   SECONDARY DIAGNOSIS:   History reviewed. No pertinent past medical history.  HOSPITAL COURSE:   46 year old male with past medical history significant for PTSD not on any medications for 2 years now, depression and anxiety presents to Hospital complaining of abdominal pain. Also noted to have EKG changes.  #1 NSTEMI- No chest pain, EKG changes with borderline ST elevation in inferior leads. - cardiac catheterization showing coronary thrombus/emboli, no stents placed - was on IV heparin until discharge - Hypercoagulable work up sent for. No source of emboli noted - CT abd. with no thrombus noted, MRI without any acute findings, no clots -Appreciate cardiology input. -discharge on eliquis and statin for now, f/u hypercoagulable work up as outpatient -also discharged on metoprolol  #2 abdominal pain-CT of the chest and abdomen is completely normal except for minimal cholecystic fluid. Patient's pain is more in the periumbilical region. Appears musculoskeletal in nature at this time. Continue as needed pain medications -No evidence of any sepsis noted. Discontinued vancomycin and Zosyn. Negative blood cultures  #3 headaches-secondary to stress and also insomnia. -Pain medications as needed.  #4 PTSD-has not been taking medications for 2 years now. Very anxious. - - appreciate psych consult,  SSRI's were offered to help with his situation, but patient refused and requesting only benzo's - substance abuse potential noted. No narcotics or benzo's at discharge - Outpatient Psych f/u recommended -Started on trazodone for insomnia at this time.  #5 tobacco use disorder-counseled and was on nicotine patch  Discharge today   DISCHARGE CONDITIONS:   Guarded  CONSULTS OBTAINED:   Treatment Team:  Iran OuchMuhammad A Arida, MD Bluford KaufmannBharati Kochar, MD Audery AmelJohn T Clapacs, MD  DRUG ALLERGIES:   Allergies  Allergen Reactions  . Fluoxetine   . Haloperidol And Related    DISCHARGE MEDICATIONS:   Allergies as of 03/25/2016      Reactions   Fluoxetine    Haloperidol And Related       Medication List    TAKE these medications   apixaban 5 MG Tabs tablet Commonly known as:  ELIQUIS Take 2 tablets (10 mg total) by mouth 2 (two) times daily. Take 2 tabs PO twice a day for 1 week and then 1 tab PO twice a day after that   atorvastatin 40 MG tablet Commonly known as:  LIPITOR Take 1 tablet (40 mg total) by mouth daily at 6 PM.   metoprolol tartrate 25 MG tablet Commonly known as:  LOPRESSOR Take 0.5 tablets (12.5 mg total) by mouth 2 (two) times daily.   traZODone 100 MG tablet Commonly known as:  DESYREL Take 1 tablet (100 mg total) by mouth at bedtime.        DISCHARGE INSTRUCTIONS:   1. Psych f/u in 1 week 2. Cardiology f/u in 1-2 weeks  DIET:   Cardiac diet  ACTIVITY:   Activity as tolerated  OXYGEN:  Home Oxygen: No.  Oxygen Delivery: room air  DISCHARGE LOCATION:   home   If you experience worsening of your admission symptoms, develop shortness of breath, life threatening emergency, suicidal or homicidal thoughts you must seek medical attention immediately by calling 911 or calling your MD immediately  if symptoms less severe.  You Must read complete instructions/literature along with all the possible adverse reactions/side effects for all the Medicines you  take and that have been prescribed to you. Take any new Medicines after you have completely understood and accpet all the possible adverse reactions/side effects.   Please note  You were cared for by a hospitalist during your hospital stay. If you have any questions about your discharge medications or the care you received while you were in the hospital after you are discharged, you can call the unit and asked to speak with the hospitalist on call if the hospitalist that took care of you is not available. Once you are discharged, your primary care physician will handle any further medical issues. Please note that NO REFILLS for any discharge medications will be authorized once you are discharged, as it is imperative that you return to your primary care physician (or establish a relationship with a primary care physician if you do not have one) for your aftercare needs so that they can reassess your need for medications and monitor your lab values.    On the day of Discharge:  VITAL SIGNS:   Blood pressure (!) 119/57, pulse (!) 102, temperature 98.2 F (36.8 C), temperature source Oral, resp. rate 19, height 5\' 8"  (1.727 m), weight 99.1 kg (218 lb 7.6 oz), SpO2 95 %.  PHYSICAL EXAMINATION:    GENERAL:  46 y.o.-year-old patient lying in the bed with no acute distress.  EYES: Pupils equal, round, reactive to light and accommodation. No scleral icterus. Extraocular muscles intact.  HEENT: Head atraumatic, normocephalic. Oropharynx and nasopharynx clear.  NECK:  Supple, no jugular venous distention. No thyroid enlargement, no tenderness.  LUNGS: Normal breath sounds bilaterally, no wheezing, rales,rhonchi or crepitation. No use of accessory muscles of respiration.  CARDIOVASCULAR: S1, S2 normal. No murmurs, rubs, or gallops.  ABDOMEN: Soft, nontender, nondistended. Bowel sounds present. No organomegaly or mass.  EXTREMITIES: No pedal edema, cyanosis, or clubbing.  NEUROLOGIC: Cranial nerves II  through XII are intact. Muscle strength 5/5 in all extremities. Sensation intact. Gait not checked.  PSYCHIATRIC: The patient is alert and oriented x 3. Very anxious and started having tremors/myoclonic jerks of his lower extremities SKIN: No obvious rash, lesion, or ulcer.   DATA REVIEW:   CBC  Recent Labs Lab 03/25/16 0602  WBC 9.3  HGB 13.3  HCT 37.2*  PLT 300    Chemistries   Recent Labs Lab 03/24/16 0257 03/25/16 0602  NA 137 137  K 3.6 3.7  CL 108 106  CO2 24 26  GLUCOSE 107* 97  BUN 18 11  CREATININE 0.85 0.72  CALCIUM 8.5* 8.7*  AST 103*  --   ALT 32  --   ALKPHOS 59  --   BILITOT 0.9  --      Microbiology Results  Results for orders placed or performed during the hospital encounter of 03/23/16  Urine culture     Status: None   Collection Time: 03/23/16  9:22 AM  Result Value Ref Range Status   Specimen Description URINE, RANDOM  Final   Special Requests NONE  Final   Culture   Final    NO  GROWTH Performed at [facility], [address], [place], [place] [address]    Report Status 03/25/2016 FINAL  Final  Blood Culture (routine x 2)     Status: None (Preliminary result)   Collection Time: 03/23/16 11:09 AM  Result Value Ref Range Status   Specimen Description BLOOD RIGHT ASSIST CONTROL  Final   Special Requests BOTTLES DRAWN AEROBIC AND ANAEROBIC ANA3ML AER2ML  Final   Culture NO GROWTH 2 DAYS  Final   Report Status PENDING  Incomplete  Blood Culture (routine x 2)     Status: None (Preliminary result)   Collection Time: 03/23/16 11:09 AM  Result Value Ref Range Status   Specimen Description BLOOD RIGHT HAND  Final   Special Requests BOTTLES DRAWN AEROBIC AND ANAEROBIC ANA2ML AER2ML  Final   Culture NO GROWTH 2 DAYS  Final   Report Status PENDING  Incomplete    RADIOLOGY:  Mr Laqueta Jean Wo Contrast  Result Date: 03/24/2016 CLINICAL DATA:  Acute lower abdominal pain and nausea this morning. Sepsis. Persistent headaches for 2 weeks, stress  and insomnia. History of myocardial infarction. EXAM: MRI HEAD WITHOUT AND WITH CONTRAST TECHNIQUE: Multiplanar, multiecho pulse sequences of the brain and surrounding structures were obtained without and with intravenous contrast. CONTRAST:  20mL MULTIHANCE GADOBENATE DIMEGLUMINE 529 MG/ML IV SOLN COMPARISON:  None. FINDINGS: INTRACRANIAL CONTENTS: No reduced diffusion to suggest acute ischemia. No susceptibility artifact to suggest hemorrhage. The ventricles and sulci are normal for patient's age. No suspicious parenchymal signal, masses, mass effect. No abnormal intraparenchymal or extra-axial enhancement. No abnormal extra-axial fluid collections. No extra-axial masses. VASCULAR: Normal major intracranial vascular flow voids present at skull base. SKULL AND UPPER CERVICAL SPINE: No abnormal sellar expansion. No suspicious calvarial bone marrow signal. Craniocervical junction maintained. SINUSES/ORBITS: Trace paranasal sinus mucosal thickening. LEFT maxillary mucosal retention cyst. Mastoid air cells are well aerated. The included ocular globes and orbital contents are non-suspicious. OTHER: None. IMPRESSION: Normal MRI of the head with and without contrast. Electronically Signed   By: Awilda Metro M.D.   On: 03/24/2016 17:39     Management plans discussed with the patient, family and they are in agreement.  CODE STATUS:     Code Status Orders        Start     Ordered   03/24/16 1044  Full code  Continuous     03/24/16 1043    Code Status History    Date Active Date Inactive Code Status Order ID Comments User Context   03/23/2016 12:04 PM 03/24/2016 10:43 AM Full Code [id number]  Ramonita Lab, MD Inpatient      TOTAL TIME TAKING CARE OF THIS PATIENT: 38 minutes.    Enid Baas M.D on 03/25/2016 at 2:44 PM  Between 7am to 6pm - Pager - [phone]  After 6pm go to [email or web address] - Scientist, research (life sciences) [facility]  Office  [phone]  CC: Primary  care physician; No PCP Per Patient   Note: This dictation was prepared with Dragon dictation along with smaller phrase technology. Any transcriptional errors that result from this process are unintentional.

## 2016-03-25 NOTE — Care Management (Signed)
Patient without insurance. He Is going to require Eliquis for one month.  Provided patient with coupon.  He is also to discharge on Trazodone, Lipitor and metoprolol.  [facility] and insttructed patient on location of the clinic.  Verbalized understanding.  Attempted to provide patient information of [facility] as a resource for his mental health issues and he threw resource in the trash.

## 2016-03-25 NOTE — Progress Notes (Signed)
Patient Name: James Abbott Date of Encounter: 03/25/2016  Primary Cardiologist: New to [facility] - consult by [facility] Problem List     Active Problems:   Sepsis [facility])   Non-ST elevation (NSTEMI) myocardial infarction (HCC)     Subjective   Still with headache and periumbilical pain. Nauseated. No appetite this morning. MRI brain without acute process. Hypercoagulable workup pending at this time. No significant arrhythmia noted on telemetry. Notes severe PTSD, asks for sedating medications.   Inpatient Medications    Scheduled Meds: . aspirin  81 mg Oral Daily  . atorvastatin  40 mg Oral q1800  . nicotine  21 mg Transdermal Daily  . nicotine  21 mg Transdermal Once  . pantoprazole (PROTONIX) IV  40 mg Intravenous Daily  . sodium chloride flush  3 mL Intravenous Q12H  . sodium chloride flush  3 mL Intravenous Q12H  . traZODone  100 mg Oral QHS   Continuous Infusions: . heparin 1,250 Units/hr (03/25/16 0650)   PRN Meds: sodium chloride, acetaminophen **OR** acetaminophen, ketorolac, LORazepam, metoCLOPramide (REGLAN) injection, ondansetron **OR** ondansetron (ZOFRAN) IV, oxyCODONE, sodium chloride flush, traMADol   Vital Signs    Vitals:   03/24/16 1100 03/24/16 1429 03/24/16 1959 03/25/16 0312  BP:  110/78 108/65 (!) 119/57  Pulse: 93 74 81 97  Resp: 13 18 18 18   Temp:   99 F (37.2 C) 98.7 F (37.1 C)  TempSrc:   Oral Oral  SpO2: 99% 99% 100% 99%  Weight: 218 lb 7.6 oz (99.1 kg)     Height: 5\' 8"  (1.727 m)       Intake/Output Summary (Last 24 hours) at 03/25/16 1016 Last data filed at 03/25/16 0636  Gross per 24 hour  Intake              240 ml  Output             1480 ml  Net            -1240 ml   Filed Weights   03/23/16 1112 03/24/16 1100  Weight: 155 lb (70.3 kg) 218 lb 7.6 oz (99.1 kg)    Physical Exam    GEN: Well nourished, well developed, in no acute distress.  HEENT: Grossly normal.  Neck: Supple, no JVD, carotid bruits, or  masses. Cardiac: Tachycardic, no murmurs, rubs, or gallops. No clubbing, cyanosis, edema.  Radials/DP/PT 2+ and equal bilaterally.  Respiratory:  Respirations regular and unlabored, clear to auscultation bilaterally. GI: Soft, tender to palpation peri-umbilical, nondistended, BS + x 4. MS: no deformity or atrophy. Skin: warm and dry, no rash. Neuro:  Strength and sensation are intact. Psych: AAOx3.  Normal affect.  Labs    CBC  Recent Labs  03/24/16 0257 03/25/16 0602  WBC 12.5* 9.3  HGB 13.3 13.3  HCT 37.9* 37.2*  MCV 87.9 87.6  PLT 313 300   Basic Metabolic Panel  Recent Labs  03/24/16 0257 03/25/16 0602  NA 137 137  K 3.6 3.7  CL 108 106  CO2 24 26  GLUCOSE 107* 97  BUN 18 11  CREATININE 0.85 0.72  CALCIUM 8.5* 8.7*   Liver Function Tests  Recent Labs  03/23/16 0755 03/24/16 0257  AST 26 103*  ALT 25 32  ALKPHOS 75 59  BILITOT 0.6 0.9  PROT 7.8 6.2*  ALBUMIN 4.7 3.5    Recent Labs  03/23/16 0755 03/24/16 1048  LIPASE 21 46   Cardiac Enzymes  Recent Labs  03/23/16 1238 03/23/16 1751 03/23/16 2355  TROPONINI 0.54* 8.45* 24.53*   BNP Invalid input(s): POCBNP D-Dimer No results for input(s): DDIMER in the last 72 hours. Hemoglobin A1C  Recent Labs  03/24/16 1048  HGBA1C 5.4   Fasting Lipid Panel  Recent Labs  03/24/16 1048  CHOL 181  HDL 51  LDLCALC 113*  TRIG 85  CHOLHDL 3.5   Thyroid Function Tests  Recent Labs  03/24/16 0257  TSH 0.918    Telemetry    Sinus tachycardia, low 100's to 120's bpm - Personally Reviewed  ECG    n/a - Personally Reviewed  Radiology    Mr Laqueta Jean Wo Contrast  Result Date: 03/24/2016 CLINICAL DATA:  Acute lower abdominal pain and nausea this morning. Sepsis. Persistent headaches for 2 weeks, stress and insomnia. History of myocardial infarction. EXAM: MRI HEAD WITHOUT AND WITH CONTRAST TECHNIQUE: Multiplanar, multiecho pulse sequences of the brain and surrounding structures were  obtained without and with intravenous contrast. CONTRAST:  20mL MULTIHANCE GADOBENATE DIMEGLUMINE 529 MG/ML IV SOLN COMPARISON:  None. FINDINGS: INTRACRANIAL CONTENTS: No reduced diffusion to suggest acute ischemia. No susceptibility artifact to suggest hemorrhage. The ventricles and sulci are normal for patient's age. No suspicious parenchymal signal, masses, mass effect. No abnormal intraparenchymal or extra-axial enhancement. No abnormal extra-axial fluid collections. No extra-axial masses. VASCULAR: Normal major intracranial vascular flow voids present at skull base. SKULL AND UPPER CERVICAL SPINE: No abnormal sellar expansion. No suspicious calvarial bone marrow signal. Craniocervical junction maintained. SINUSES/ORBITS: Trace paranasal sinus mucosal thickening. LEFT maxillary mucosal retention cyst. Mastoid air cells are well aerated. The included ocular globes and orbital contents are non-suspicious. OTHER: None. IMPRESSION: Normal MRI of the head with and without contrast. Electronically Signed   By: Awilda Metro M.D.   On: 03/24/2016 17:39    Cardiac Studies   LHC 03/24/2016: Conclusion     Dist LAD lesion, 85 %stenosed.  LPDA-2 lesion, 100 %stenosed.  LPDA-1 lesion, 70 %stenosed.  The left ventricular systolic function is normal.  LV end diastolic pressure is normal.  The left ventricular ejection fraction is 50-55% by visual estimate.   1. 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 This is suggestive of coronary embolization of unclear source. There is no evidence of plaque rupture.  2. Low normal LV systolic function with an EF of 50-55%. There is peri-apical hypokinesis. Normal left ventricular end-diastolic pressure.  Recommendations: The angiogram is suggestive of coronary embolization of unknown source. I recommend resuming heparin drip 6  hours after sheath pull. There is no evidence of apical thrombus. The patient has been having headaches for about 2 weeks and also his presentation was periumbilical pain which is worrisome for possible embolization. I recommend hypercoagulable workup as well as evaluation for possible systemic illness. Consider MRI of the brain. He might require long-term anticoagulation.    Echo 03/23/16: Study Conclusions  - Left ventricle: The cavity size was normal. Systolic function was   normal. The estimated ejection fraction was in the range of 60%   to 65%. Hypokinesis of the apical and periapical myocardium   including apical septal wall. Left ventricular diastolic function   parameters were normal. - Left atrium: The atrium was normal in size. - Right ventricle: Systolic function was normal. - Tricuspid valve: There was mild-moderate regurgitation. - Pulmonary arteries: Systolic pressure was midlly elevated. PA  peak pressure: 39 mm Hg (S).  Impressions:  - Please note hypokinesis in the apical region. Definity used for   better visualization.  Patient Profile     46 y.o. male with no history of prior cardiac disease or other known chronic medical condition who [facility] with periumbilical pain and headache. Found to have a NSTEMI with a peak troponin of 24.53. LHC on 2/11 showed subtotal thrombotic occlusion of the distal LAD and a small branch of the left PDA with o/e normal coronary arteries. EF 50-55% by LV gram and 60-65% by echo.   Assessment & Plan    1. NSTEMI: -As above with subtotal thrombotic occlusion of the distal LAD and a small branch of the left PDA -Concerning for coronary embolization of unknown source, continue to monitor on telemetry for significant arrhythmia, if none is seen may require outpatient cardiac monitoring  -Continue heparin gtt for now -May need long-term full-dose anticoagulation  -Hypercoagulable workup pending -MRI brain  non-acute -Start low-dose metoprolol  -Once started on anticoagulation can stop ASA to avoid dual therapy   2. Abdominal pain: -Seen by GI -No evidence of infection -Requests sedating medications  3. Headache: -MRI brain non-acute  4. PTSD: -Requests sedating medications -Per IM  5. Sinus tachycardia: -Likely 2/2 pain/stress -Lopressor as above   Signed, Eula ListenRyan Dunn, PA-C [facility] Pager: [phone] 03/25/2016, 10:16 AM

## 2016-03-25 NOTE — Progress Notes (Signed)
ANTICOAGULATION CONSULT NOTE - Initial Consult  Pharmacy Consult for Apixaban Indication: NSTEMI with subtotal occlusion of LAD, source unknown.  Allergies  Allergen Reactions  . Fluoxetine   . Haloperidol And Related     Patient Measurements: Height: 5\' 8"  (172.7 cm) Weight: 218 lb 7.6 oz (99.1 kg) IBW/kg (Calculated) : 68.4 Heparin Dosing Weight:    Vital Signs: Temp: 98.2 F (36.8 C) (02/12 1125) Temp Source: Oral (02/12 1125) BP: 119/57 (02/12 0312) Pulse Rate: 102 (02/12 1125)  Labs:  Recent Labs  03/23/16 0755  03/23/16 1238 03/23/16 1751 03/23/16 2355 03/24/16 0257 03/24/16 2218 03/25/16 0602  HGB 15.3  --   --   --   --  13.3  --  13.3  HCT 43.1  --   --   --   --  37.9*  --  37.2*  PLT 390  --   --   --   --  313  --  300  HEPARINUNFRC  --   --   --   --   --  0.16* 0.12* 0.75*  CREATININE 1.16  --   --   --   --  0.85  --  0.72  TROPONINI 0.03*  < > 0.54* 8.45* 24.53*  --   --   --   < > = values in this interval not displayed.  Estimated Creatinine Clearance: 134.5 mL/min (by C-G formula based on SCr of 0.72 mg/dL).   Medical History: History reviewed. No pertinent past medical history.  Assessment: Patient admitted for NSTEMI. Currently ordered Heparin drip. Patient with thrombus in LAD, unknown source of clot. MD request pharmacy to dose apixaban for DVT treatment.  Plan:  Will transition patient from Heparin drip to apixaban 10mg  PO BID x 7 days followed by 5mg  BID.  Clovia CuffLisa Corwin Kuiken, PharmD, BCPS 03/25/2016 11:46 AM

## 2016-03-25 NOTE — Progress Notes (Signed)
Patient severely agitated. Wanted to Leave AMA. Education given to patient on the importance of waiting for me to discharge him and that if he left now we would be unable to give him his prescriptions or help with obtaining them. Patient verbalized understanding and stated he would wait but not very long.  All DC instructions given to patient and instructions on medications and where to pick them up given. Also the importance of taking them. IV discontinued and tele box removed and returned.

## 2016-03-25 NOTE — Progress Notes (Signed)
Patient is resting in no acute distress.  States that he is still feeling very anxious.  Patient states that the 1mg  Ativan did not improve his anxiety.  Reports that medication (15mg  ketorolac) did not improve pain much either.  Rates his headache at 8/10 and midline abdominal pain at 8/10.  Also complains of nausea.  Pt states that "I have the worst PTSD ever.  It is so bad my hair turned gray at 46 years old."  Pt states that if he could get his anxiety under control that his pain may improve.  Will inform hospitalist.

## 2016-03-26 LAB — BETA-2-GLYCOPROTEIN I ABS, IGG/M/A: Beta-2-Glycoprotein I IgA: <9 GPI IgA units (ref 0–25)

## 2016-03-26 LAB — PROTEIN S, TOTAL: PROTEIN S AG TOTAL: 91 % (ref 60–150)

## 2016-03-26 LAB — PROTEIN C ACTIVITY: PROTEIN C ACTIVITY: 112 % (ref 73–180)

## 2016-03-26 LAB — PROTEIN S ACTIVITY: Protein S Activity: 122 % (ref 63–140)

## 2016-03-26 LAB — PROTEIN C, TOTAL: Protein C, Total: 89 % (ref 60–150)

## 2016-03-26 NOTE — ED Notes (Addendum)
Blood Cultures drawn prior to ABX administration per protocol

## 2016-03-27 LAB — CARDIOLIPIN ANTIBODIES, IGG, IGM, IGA: Anticardiolipin IgG: <9 GPL U/mL (ref 0–14)

## 2016-03-27 LAB — LUPUS ANTICOAGULANT PANEL
DRVVT: 37.1 s (ref 0.0–47.0)
PTT LA: 62.8 s — AB (ref 0.0–51.9)

## 2016-03-27 LAB — PTT-LA MIX: PTT-LA MIX: 57.5 s — AB (ref 0.0–48.9)

## 2016-03-27 LAB — HEXAGONAL PHASE PHOSPHOLIPID: Hexagonal Phase Phospholipid: 10 s (ref 0–11)

## 2016-03-28 ENCOUNTER — Telehealth: Payer: Self-pay

## 2016-03-28 LAB — CULTURE, BLOOD (ROUTINE X 2)
CULTURE: NO GROWTH
Culture: NO GROWTH

## 2016-03-28 LAB — FACTOR 5 LEIDEN

## 2016-03-28 NOTE — Telephone Encounter (Signed)
Scheduled 04-12-16 at 3 pm

## 2016-03-28 NOTE — Telephone Encounter (Signed)
-----   Message from Iran OuchMuhammad A Arida, MD sent at 03/28/2016  7:47 AM EST ----- Needs a follow up visit post hospital discharge. Within 2 weeks.

## 2016-03-28 NOTE — Telephone Encounter (Signed)
l mom to call and schedule tcm/ph appt.

## 2016-04-01 LAB — PROTHROMBIN GENE MUTATION

## 2016-04-03 ENCOUNTER — Telehealth: Payer: Self-pay | Admitting: Pharmacy Technician

## 2016-04-03 ENCOUNTER — Telehealth: Payer: Self-pay | Admitting: Cardiovascular Disease

## 2016-04-03 ENCOUNTER — Ambulatory Visit: Payer: Self-pay | Admitting: Pharmacy Technician

## 2016-04-03 DIAGNOSIS — Z79899 Other long term (current) drug therapy: Secondary | ICD-10-CM

## 2016-04-03 NOTE — Telephone Encounter (Signed)
Medication management called, states pt misplaced his coupon card for Eliqus free 30 day supply, or samples. Please call pt and let him know if we have this available.

## 2016-04-03 NOTE — Telephone Encounter (Signed)
Obtained 30 trial card for Eliquis from Rep.  Contacted pt.  [facility] to [facility].  Sherilyn DacostaBetty J. Otelia Hettinger Care Manager [facility]

## 2016-04-03 NOTE — Progress Notes (Signed)
Met with patient completed financial assistance application for [facility] due to recent hospital visit.  Patient agreed to be responsible for gathering financial information and forwarding to appropriate department in [facility].    Completed [facility] application and contract.  Patient agreed to all terms of the [facility] contract.  Patient to provide notarized letter verifying monthly income.  Provided patient with Civil engineer, contracting based on his particular needs.   Patient indicated that he can not locate the Eliquis 30 day trial coupon given to him when he was discharged from hospital.  Have reached out to Lilia Pro to see if she can provide patient with another one.    Patient indicated that he is to see Dr. Fletcher Anon on 04/12/16.  Spoke with Sabrina at Dr. Tyrell Antonio office about providing samples of the Eliquis or a coupon.  Note sent back to nursing staff.  Dr. Tyrell Antonio office is to follow-up with patient.  Eliquis Prescription Application completed with patient.  Forwarded to Dr. Audelia Acton for signature.  Upon receipt of signed application from provider and proof of income from patient, Eliquis Prescription Application will be submitted to [employer].  [facility]

## 2016-04-03 NOTE — Telephone Encounter (Signed)
Spoke with patient and he is aware his samples and 30 day trial are up front.  2 boxes of Eliquis 5 mg  Lot number: [id number] Expiration date 3/20

## 2016-04-12 ENCOUNTER — Encounter: Payer: Self-pay | Admitting: Cardiovascular Disease

## 2016-04-12 ENCOUNTER — Ambulatory Visit (INDEPENDENT_AMBULATORY_CARE_PROVIDER_SITE_OTHER): Payer: Self-pay | Admitting: Cardiovascular Disease

## 2016-04-12 VITALS — BP 112/74 | HR 65 | Ht 68.0 in | Wt 160.5 lb

## 2016-04-12 DIAGNOSIS — I252 Old myocardial infarction: Secondary | ICD-10-CM

## 2016-04-12 DIAGNOSIS — E785 Hyperlipidemia, unspecified: Secondary | ICD-10-CM

## 2016-04-12 MED ORDER — ASPIRIN EC 81 MG PO TBEC: 81.0000 mg | DELAYED_RELEASE_TABLET | Freq: Every day | ORAL | 3 refills | Status: AC

## 2016-04-12 MED ORDER — CLOPIDOGREL BISULFATE 75 MG PO TABS: 75.0000 mg | ORAL_TABLET | Freq: Every day | ORAL | 3 refills | Status: DC

## 2016-04-12 NOTE — Patient Instructions (Signed)
Medication Instructions:  Your physician has recommended you make the following change in your medication:  START taking aspirin  aspirin TAKE eliquis  twice daiily FOR ONE MONTH THEN STOP TAKING IT April 2 START taking Plavix  once daily AFTER you stop taking Eliquis (April 2)   Labwork: none  Testing/Procedures: none  Follow-Up: Your physician recommends that you schedule a follow-up appointment in: two months with Dr. Kirke Corin.    Any Other Special Instructions Will Be Listed Below (If Applicable).     If you need a refill on your cardiac medications before your next appointment, please call your pharmacy.

## 2016-04-12 NOTE — Progress Notes (Signed)
Cardiology Office Note   Date:  04/12/2016   ID:  James Abbott, DOB Aug 18, 1970, MRN [id number]  PCP:  No PCP Per Patient  Cardiologist:   James Bears, MD   Chief Complaint  Patient presents with  . other    F/u hospital c/o rapid heart beat , unable to sleep and panic attacks. Meds reviewed verbally with pt.      History of Present Illness: James Abbott is a 46 y.o. male who presents for A follow-up visit after recent hospitalization with non-ST elevation myocardial infarction. He has chronic medical conditions that include tobacco use and previous cocaine use. He presented recently to [facility] with severe lower abdominal pain with no chest pain. EKG showed borderline ST elevation in the inferior leads as well as V2 to V4. CT abdomen was unremarkable except for small amount of pericholecystic fluid. Troponin peaked at 24. Echocardiogram showed normal LV systolic function with severe hypokinesis of the apical area. I proceeded with urgent cardiac catheterization which showed subtotal thrombotic occlusion of the distal LAD close to the apex and possibly a small branch of the left PDA. 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 The appearance was suggestive of coronary embolization. EF was 50-55% with apical akinesis. Due to headaches, the patient underwent brain MRI which was unremarkable. He had hypercoagulable workup which also was unremarkable. He was discharged home on Eliquis.  The patient suffers from severe anxiety, depression, PTSD and previous drug use. He continues to be extremely anxious and he complains of frequent panic attacks. He was seen by psychiatry while hospitalized. He was advised to follow-up with the [facility] but he did not want to as he felt that they did not help his condition in the past. He had no recurrent chest pain but does complain of shortness of breath which  is chronic.    Past Medical History:  Diagnosis Date  . Hypertension   . MI (myocardial infarction)     Past Surgical History:  Procedure Laterality Date  . CARDIAC CATHETERIZATION    . LEFT HEART CATH AND CORONARY ANGIOGRAPHY N/A 03/24/2016   Procedure: Left Heart Cath and Coronary Angiography;  Surgeon: James Ouch, MD;  Location: [facility] INVASIVE CV LAB;  Service: Cardiovascular;  Laterality: N/A;     Current Outpatient Prescriptions  Medication Sig Dispense Refill  . apixaban (ELIQUIS) 5 MG TABS tablet Take 2 tablets (10 mg total) by mouth 2 (two) times daily. Take 2 tabs PO twice a day for 1 week and then 1 tab PO twice a day after that (Patient taking differently: Take 5 mg by mouth 2 (two) times daily. ) 60 tablet 0  . atorvastatin (LIPITOR) 40 MG tablet Take 1 tablet (40 mg total) by mouth daily at 6 PM. 30 tablet 2  . metoprolol tartrate (LOPRESSOR) 25 MG tablet Take 0.5 tablets (12.5 mg total) by mouth 2 (two) times daily. 30 tablet 2  . traZODone (DESYREL) 100 MG tablet Take 1 tablet (100 mg total) by mouth at bedtime. 30 tablet 2   No current facility-administered medications for this visit.     Allergies:   Fluoxetine and Haloperidol and related    Social History:  The patient  reports that he has been smoking Cigarettes.  He has a 7.50 pack-year smoking history. He has never used smokeless tobacco. He reports that he does not  drink alcohol or use drugs.   Family History:  The patient's family history includes Heart attack in his maternal uncle and paternal uncle.    ROS:  Please see the history of present illness.   Otherwise, review of systems are positive for none.   All other systems are reviewed and negative.    PHYSICAL EXAM: VS:  BP 112/74 (BP Location: Right Arm, Patient Position: Sitting, Cuff Size: Normal)   Pulse 65   Ht 5\' 8"  (1.727 m)   Wt 160 lb 8 oz (72.8 kg)   BMI 24.40 kg/m  , BMI Body mass index is 24.4 kg/m. GEN: Well nourished, well  developed, in no acute distress  HEENT: normal  Neck: no JVD, carotid bruits, or masses Cardiac: RRR; no murmurs, rubs, or gallops,no edema  Respiratory:  clear to auscultation bilaterally, normal work of breathing GI: soft, nontender, nondistended, + BS MS: no deformity or atrophy  Skin: warm and dry, no rash Neuro:  Strength and sensation are intact Psych: euthymic mood, full affect   EKG:  EKG is ordered today. The ekg ordered today demonstrates normal sinus rhythm with no significant ST or T wave changes.   Recent Labs: 03/24/2016: ALT 32; TSH 0.918 03/25/2016: BUN 11; Creatinine, Ser 0.72; Hemoglobin 13.3; Platelets 300; Potassium 3.7; Sodium 137    Lipid Panel    Component Value Date/Time   CHOL 181 03/24/2016 1048   TRIG 85 03/24/2016 1048   HDL 51 03/24/2016 1048   CHOLHDL 3.5 03/24/2016 1048   VLDL 17 03/24/2016 1048   LDLCALC 113 (H) 03/24/2016 1048      Wt Readings from Last 3 Encounters:  04/12/16 160 lb 8 oz (72.8 kg)  03/24/16 218 lb 7.6 oz (99.1 kg)       PAD Screen 04/12/2016  Previous PAD dx? No  Previous surgical procedure? No  Pain with walking? Yes  Subsides with rest? Yes  Feet/toe relief with dangling? Yes  Painful, non-healing ulcers? No  Extremities discolored? No      ASSESSMENT AND PLAN:  1.  Coronary artery disease involving native coronary arteries without angina: Still the exact etiology for the patient's presentation is not clear but there was really no source of embolism. I reviewed the angiogram again and the most likely etiology would be in situ thrombosis in the proximal LAD with no significant plaque with subsequent embolization into the distal LAD. I added aspirin 81 mg once daily. Continue Eliquis for now but am planning to use this for only one month and then switch him to clopidogrel.  2. Hyperlipidemia: Continue treatment with atorvastatin.  3. Severe anxiety and PTSD: I [facility] but he doesn't want to.    Disposition:   FU with me in 2 months  Signed,  James BearsMuhammad Wm Sahagun, MD  04/12/2016 3:15 PM    [facility]

## 2016-05-15 ENCOUNTER — Telehealth: Payer: Self-pay | Admitting: Pharmacy Technician

## 2016-05-15 NOTE — Telephone Encounter (Signed)
Patient approved to receive medication assistance through 2018, as long as total household income does not exceed 250% FPL or pt obtains prescription coverage.  James Abbott Care Manager [facility]

## 2016-06-13 ENCOUNTER — Ambulatory Visit: Payer: Self-pay | Admitting: Cardiovascular Disease

## 2016-06-13 NOTE — Progress Notes (Deleted)
Cardiology Office Note   Date:  06/13/2016   ID:  James Abbott, DOB April 28, 1970, MRN [id number]  PCP:  No PCP Per Patient  Cardiologist:   Lorine Bears, MD   No chief complaint on file.     History of Present Illness: James Abbott is a 46 y.o. male who presents for A follow-up visit after recent hospitalization with non-ST elevation myocardial infarction. He has chronic medical conditions that include tobacco use and previous cocaine use. He presented recently to [facility] with severe lower abdominal pain with no chest pain. EKG showed borderline ST elevation in the inferior leads as well as V2 to V4. CT abdomen was unremarkable except for small amount of pericholecystic fluid. Troponin peaked at 24. Echocardiogram showed normal LV systolic function with severe hypokinesis of the apical area. I proceeded with urgent cardiac catheterization which showed subtotal thrombotic occlusion of the distal LAD close to the apex and possibly a small branch of the left PDA. 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 The appearance was suggestive of coronary embolization. EF was 50-55% with apical akinesis. Due to headaches, the patient underwent brain MRI which was unremarkable. He had hypercoagulable workup which also was unremarkable. He was discharged home on Eliquis.  The patient suffers from severe anxiety, depression, PTSD and previous drug use. He continues to be extremely anxious and he complains of frequent panic attacks. He was seen by psychiatry while hospitalized. He was advised to follow-up with the [facility] but he did not want to as he felt that they did not help his condition in the past. He had no recurrent chest pain but does complain of shortness of breath which is chronic.    Past Medical History:  Diagnosis Date  . Hypertension   . MI (myocardial infarction)     Past Surgical  History:  Procedure Laterality Date  . CARDIAC CATHETERIZATION    . LEFT HEART CATH AND CORONARY ANGIOGRAPHY N/A 03/24/2016   Procedure: Left Heart Cath and Coronary Angiography;  Surgeon: Iran Ouch, MD;  Location: [facility] INVASIVE CV LAB;  Service: Cardiovascular;  Laterality: N/A;     Current Outpatient Prescriptions  Medication Sig Dispense Refill  . apixaban (ELIQUIS) 5 MG TABS tablet Take 2 tablets (10 mg total) by mouth 2 (two) times daily. Take 2 tabs PO twice a day for 1 week and then 1 tab PO twice a day after that (Patient taking differently: Take 5 mg by mouth 2 (two) times daily. ) 60 tablet 0  . aspirin EC 81 MG tablet Take 1 tablet (81 mg total) by mouth daily. 90 tablet 3  . atorvastatin (LIPITOR) 40 MG tablet Take 1 tablet (40 mg total) by mouth daily at 6 PM. 30 tablet 2  . clopidogrel (PLAVIX) 75 MG tablet Take 1 tablet (75 mg total) by mouth daily. 90 tablet 3  . metoprolol tartrate (LOPRESSOR) 25 MG tablet Take 0.5 tablets (12.5 mg total) by mouth 2 (two) times daily. 30 tablet 2  . traZODone (DESYREL) 100 MG tablet Take 1 tablet (100 mg total) by mouth at bedtime. 30 tablet 2   No current facility-administered medications for this visit.     Allergies:   Fluoxetine and Haloperidol and related    Social History:  The patient  reports that he has been smoking Cigarettes.  He has a 7.50 pack-year smoking history.  He has never used smokeless tobacco. He reports that he does not drink alcohol or use drugs.   Family History:  The patient's family history includes Heart attack in his maternal uncle and paternal uncle.    ROS:  Please see the history of present illness.   Otherwise, review of systems are positive for none.   All other systems are reviewed and negative.    PHYSICAL EXAM: VS:  There were no vitals taken for this visit. , BMI There is no height or weight on file to calculate BMI. GEN: Well nourished, well developed, in no acute distress  HEENT: normal    Neck: no JVD, carotid bruits, or masses Cardiac: RRR; no murmurs, rubs, or gallops,no edema  Respiratory:  clear to auscultation bilaterally, normal work of breathing GI: soft, nontender, nondistended, + BS MS: no deformity or atrophy  Skin: warm and dry, no rash Neuro:  Strength and sensation are intact Psych: euthymic mood, full affect   EKG:  EKG is ordered today. The ekg ordered today demonstrates normal sinus rhythm with no significant ST or T wave changes.   Recent Labs: 03/24/2016: ALT 32; TSH 0.918 03/25/2016: BUN 11; Creatinine, Ser 0.72; Hemoglobin 13.3; Platelets 300; Potassium 3.7; Sodium 137    Lipid Panel    Component Value Date/Time   CHOL 181 03/24/2016 1048   TRIG 85 03/24/2016 1048   HDL 51 03/24/2016 1048   CHOLHDL 3.5 03/24/2016 1048   VLDL 17 03/24/2016 1048   LDLCALC 113 (H) 03/24/2016 1048      Wt Readings from Last 3 Encounters:  04/12/16 160 lb 8 oz (72.8 kg)  03/24/16 218 lb 7.6 oz (99.1 kg)       PAD Screen 04/12/2016  Previous PAD dx? No  Previous surgical procedure? No  Pain with walking? Yes  Subsides with rest? Yes  Feet/toe relief with dangling? Yes  Painful, non-healing ulcers? No  Extremities discolored? No      ASSESSMENT AND PLAN:  1.  Coronary artery disease involving native coronary arteries without angina: Still the exact etiology for the patient's presentation is not clear but there was really no source of embolism. I reviewed the angiogram again and the most likely etiology would be in situ thrombosis in the proximal LAD with no significant plaque with subsequent embolization into the distal LAD. I added aspirin 81 mg once daily. Continue Eliquis for now but am planning to use this for only one month and then switch him to clopidogrel.  2. Hyperlipidemia: Continue treatment with atorvastatin.  3. Severe anxiety and PTSD: I [facility] but he doesn't want  to.    Disposition:   FU with me in 2 months  Signed,  Lorine BearsMuhammad Arida, MD  06/13/2016 9:38 AM    [facility]

## 2016-06-14 ENCOUNTER — Encounter: Payer: Self-pay | Admitting: Cardiovascular Disease

## 2016-12-18 ENCOUNTER — Emergency Department: Payer: Self-pay

## 2016-12-18 ENCOUNTER — Emergency Department
Admission: EM | Admit: 2016-12-18 | Discharge: 2016-12-19 | Disposition: A | Discharge status: Other Acute Inpatient Hospital | Payer: Self-pay | Attending: Emergency Medicine | Admitting: Emergency Medicine

## 2016-12-18 ENCOUNTER — Encounter: Payer: Self-pay | Admitting: Emergency Medicine

## 2016-12-18 DIAGNOSIS — Z79899 Other long term (current) drug therapy: Secondary | ICD-10-CM | POA: Insufficient documentation

## 2016-12-18 DIAGNOSIS — F1411 Cocaine abuse, in remission: Secondary | ICD-10-CM

## 2016-12-18 DIAGNOSIS — F251 Schizoaffective disorder, depressive type: Secondary | ICD-10-CM

## 2016-12-18 DIAGNOSIS — R079 Chest pain, unspecified: Secondary | ICD-10-CM | POA: Insufficient documentation

## 2016-12-18 DIAGNOSIS — F29 Unspecified psychosis not due to a substance or known physiological condition: Secondary | ICD-10-CM

## 2016-12-18 DIAGNOSIS — I10 Essential (primary) hypertension: Secondary | ICD-10-CM | POA: Insufficient documentation

## 2016-12-18 DIAGNOSIS — Z7901 Long term (current) use of anticoagulants: Secondary | ICD-10-CM | POA: Insufficient documentation

## 2016-12-18 DIAGNOSIS — F17210 Nicotine dependence, cigarettes, uncomplicated: Secondary | ICD-10-CM | POA: Insufficient documentation

## 2016-12-18 DIAGNOSIS — F4310 Post-traumatic stress disorder, unspecified: Secondary | ICD-10-CM | POA: Diagnosis present

## 2016-12-18 DIAGNOSIS — R45851 Suicidal ideations: Secondary | ICD-10-CM | POA: Insufficient documentation

## 2016-12-18 DIAGNOSIS — Z7982 Long term (current) use of aspirin: Secondary | ICD-10-CM | POA: Insufficient documentation

## 2016-12-18 DIAGNOSIS — F1210 Cannabis abuse, uncomplicated: Secondary | ICD-10-CM | POA: Diagnosis present

## 2016-12-18 DIAGNOSIS — G2571 Drug induced akathisia: Secondary | ICD-10-CM

## 2016-12-18 DIAGNOSIS — I252 Old myocardial infarction: Secondary | ICD-10-CM | POA: Insufficient documentation

## 2016-12-18 DIAGNOSIS — Z9861 Coronary angioplasty status: Secondary | ICD-10-CM | POA: Insufficient documentation

## 2016-12-18 LAB — CBC WITH DIFFERENTIAL/PLATELET
Basophils Absolute: 0.1 10*3/uL (ref 0–0.1)
Basophils Relative: 1 %
EOS PCT: 5 %
Eosinophils Absolute: 0.5 10*3/uL (ref 0–0.7)
HEMATOCRIT: 40.6 % (ref 40.0–52.0)
Hemoglobin: 14 g/dL (ref 13.0–18.0)
LYMPHS ABS: 1.8 10*3/uL (ref 1.0–3.6)
LYMPHS PCT: 17 %
MCH: 30.9 pg (ref 26.0–34.0)
MCHC: 34.5 g/dL (ref 32.0–36.0)
MCV: 89.5 fL (ref 80.0–100.0)
MONO ABS: 0.7 10*3/uL (ref 0.2–1.0)
MONOS PCT: 6 %
NEUTROS ABS: 7.4 10*3/uL — AB (ref 1.4–6.5)
Neutrophils Relative %: 71 %
PLATELETS: 309 10*3/uL (ref 150–440)
RBC: 4.53 MIL/uL (ref 4.40–5.90)
RDW: 13.2 % (ref 11.5–14.5)
WBC: 10.4 10*3/uL (ref 3.8–10.6)

## 2016-12-18 LAB — BASIC METABOLIC PANEL
Anion gap: 6 (ref 5–15)
BUN: 14 mg/dL (ref 6–20)
CHLORIDE: 103 mmol/L (ref 101–111)
CO2: 26 mmol/L (ref 22–32)
Calcium: 9 mg/dL (ref 8.9–10.3)
Creatinine, Ser: 0.69 mg/dL (ref 0.61–1.24)
GFR calc Af Amer: >60 mL/min (ref >60)
GFR calc non Af Amer: >60 mL/min (ref >60)
GLUCOSE: 106 mg/dL — AB (ref 65–99)
Potassium: 4.2 mmol/L (ref 3.5–5.1)
SODIUM: 135 mmol/L (ref 135–145)

## 2016-12-18 LAB — URINE DRUG SCREEN, QUALITATIVE (ARMC ONLY)
Amphetamines, Ur Screen: NOT DETECTED
BENZODIAZEPINE, UR SCRN: NOT DETECTED
Barbiturates, Ur Screen: NOT DETECTED
CANNABINOID 50 NG, UR ~~LOC~~: POSITIVE — AB
Cocaine Metabolite,Ur ~~LOC~~: NOT DETECTED
MDMA (Ecstasy)Ur Screen: NOT DETECTED
Methadone Scn, Ur: NOT DETECTED
OPIATE, UR SCREEN: POSITIVE — AB
PHENCYCLIDINE (PCP) UR S: NOT DETECTED
Tricyclic, Ur Screen: NOT DETECTED

## 2016-12-18 LAB — TROPONIN I: Troponin I: <0.03 ng/mL (ref <0.03)

## 2016-12-18 MED ORDER — TRAZODONE HCL 100 MG PO TABS
ORAL_TABLET | ORAL | Status: AC
  Filled 2016-12-18: qty 1

## 2016-12-18 MED ORDER — LORAZEPAM 1 MG PO TABS
1.0000 mg | ORAL_TABLET | Freq: Once | ORAL | Status: AC
  Administered 2016-12-18: 1 mg via ORAL
  Filled 2016-12-18: qty 1

## 2016-12-18 MED ORDER — NICOTINE 21 MG/24HR TD PT24
MEDICATED_PATCH | TRANSDERMAL | Status: AC
  Administered 2016-12-18: 21 mg via TRANSDERMAL
  Filled 2016-12-18: qty 1

## 2016-12-18 MED ORDER — TRAZODONE HCL 50 MG PO TABS
150.0000 mg | ORAL_TABLET | Freq: Every day | ORAL | Status: DC
  Administered 2016-12-18: 150 mg via ORAL

## 2016-12-18 MED ORDER — TRAZODONE HCL 50 MG PO TABS
ORAL_TABLET | ORAL | Status: AC
  Filled 2016-12-18: qty 1

## 2016-12-18 MED ORDER — NICOTINE 21 MG/24HR TD PT24
21.0000 mg | MEDICATED_PATCH | Freq: Once | TRANSDERMAL | Status: DC
  Administered 2016-12-18: 21 mg via TRANSDERMAL

## 2016-12-18 NOTE — ED Notes (Signed)
Pt tearful upon approach. Pt stated he "can't hold it together anymore."  Pt endorses SI / AH.  Pt stated [facility]. "I want the voices to stop. Can you help me?"   Pt cooperative with staff. Maintained on 15 minute checks and observation by security camera for safety.

## 2016-12-18 NOTE — ED Notes (Signed)
Pt. Alert and oriented, warm and dry, in no distress. Pt. Denies HI, and VH. Pt states hearing voices and having SI. Patient states voices are telling him to hurt himself. Patient contracts for safety with this Clinical research associatewriter. This writer advised patient to try to lay down and try to get some rest.  Pt. Encouraged to let nursing staff know of any concerns or needs.

## 2016-12-18 NOTE — ED Notes (Signed)

## 2016-12-18 NOTE — ED Provider Notes (Signed)
[facility] Emergency Department Provider Note ____________________________________________   I have reviewed the triage vital signs and the triage nursing note.  HISTORY  Chief Complaint Chest Pain   Historian Patient  HPI James Abbott is a 46 y.o. male with history of hypertension and myocardial infarction, also psychiatric disease and PTSD, states he follows with a psychiatrist locally, but has not seen a primary doctor in a while, and has been off of his medication for 2 months, presents today with sharp intermittent chest pains to the left side of his chest happened this morning, currently gone.  Lasted a few minutes.  Not pleuritic.  No shortness of breath.  No nausea.  No dizziness or passing out.  Symptoms are gone.  This did not feel similar to prior MI.  He is prescribed to take baby aspirin, but has not been taking it.  States last medications were prescribed in the hospital, and he ran out, does not have a primary care doctor.  States he did not have insurance.  Currently asymptomatic.  Patient states moods have been stable.   Past Medical History:  Diagnosis Date  . Hypertension   . MI (myocardial infarction) [facility](HCC)     Patient Active Problem List   Diagnosis Date Noted  . PTSD (post-traumatic stress disorder) 03/25/2016  . Marijuana abuse 03/25/2016  . Noncompliance 03/25/2016  . History of cocaine abuse 03/25/2016  . Non-ST elevation (NSTEMI) myocardial infarction (HCC)   . Sepsis (HCC) 03/23/2016    Past Surgical History:  Procedure Laterality Date  . CARDIAC CATHETERIZATION      Prior to Admission medications   Medication Sig Start Date End Date Taking? Authorizing Provider  apixaban (ELIQUIS) 5 MG TABS tablet Take 2 tablets (10 mg total) by mouth 2 (two) times daily. Take 2 tabs PO twice a day for 1 week and then 1 tab PO twice a day after that Patient taking differently: Take 5 mg by mouth 2 (two) times daily.  03/25/16    Enid BaasKalisetti, Radhika, MD  aspirin EC 81 MG tablet Take 1 tablet (81 mg total) by mouth daily. 04/12/16   Iran OuchArida, Muhammad A, MD  atorvastatin (LIPITOR) 40 MG tablet Take 1 tablet (40 mg total) by mouth daily at 6 PM. 03/25/16   Enid BaasKalisetti, Radhika, MD  clopidogrel (PLAVIX) 75 MG tablet Take 1 tablet (75 mg total) by mouth daily. 05/13/16   Iran OuchArida, Muhammad A, MD  metoprolol tartrate (LOPRESSOR) 25 MG tablet Take 0.5 tablets (12.5 mg total) by mouth 2 (two) times daily. 03/25/16   Enid BaasKalisetti, Radhika, MD  traZODone (DESYREL) 100 MG tablet Take 1 tablet (100 mg total) by mouth at bedtime. 03/25/16   Enid BaasKalisetti, Radhika, MD    Allergies  Allergen Reactions  . Fluoxetine   . Haloperidol And Related     Family History  Problem Relation Age of Onset  . Heart attack Maternal Uncle   . Heart attack Paternal Uncle     Social History Social History   Tobacco Use  . Smoking status: Current Every Day Smoker    Packs/day: 0.25    Years: 30.00    Pack years: 7.50    Types: Cigarettes  . Smokeless tobacco: Never Used  Substance Use Topics  . Alcohol use: No  . Drug use: No    Review of Systems  Constitutional: Negative for fever. Eyes: Negative for visual changes. ENT: Negative for sore throat. Cardiovascular: Positive for chest pain. Respiratory: Negative for shortness of breath. Gastrointestinal: Negative  for abdominal pain, vomiting and diarrhea. Genitourinary: Negative for dysuria. Musculoskeletal: Negative for back pain. Skin: Negative for rash. Neurological: Negative for headache.  ____________________________________________   PHYSICAL EXAM:  VITAL SIGNS: ED Triage Vitals  Enc Vitals Group     BP 12/18/16 1501 115/76     Pulse Rate 12/18/16 1501 68     Resp 12/18/16 1501 18     Temp 12/18/16 1501 98.3 F (36.8 C)     Temp Source 12/18/16 1501 Oral     SpO2 12/18/16 1501 96 %     Weight 12/18/16 1500 168 lb (76.2 kg)     Height 12/18/16 1500 5\' 8"  (1.727 m)     Head  Circumference --      Peak Flow --      Pain Score 12/18/16 1500 7     Pain Loc --      Pain Edu? --      Excl. in GC? --      Constitutional: Alert and oriented. Well appearing and in no distress. HEENT   Head: Normocephalic and atraumatic.      Eyes: Conjunctivae are normal. Pupils equal and round.       Ears:         Nose: No congestion/rhinnorhea.   Mouth/Throat: Mucous membranes are moist.   Neck: No stridor. Cardiovascular/Chest: Normal rate, regular rhythm.  No murmurs, rubs, or gallops. Respiratory: Normal respiratory effort without tachypnea nor retractions. Breath sounds are clear and equal bilaterally. No wheezes/rales/rhonchi. Gastrointestinal: Soft. No distention, no guarding, no rebound. Nontender.    Genitourinary/rectal:Deferred Musculoskeletal: Nontender with normal range of motion in all extremities. No joint effusions.  No lower extremity tenderness.  No edema. Neurologic:  Normal speech and language. No gross or focal neurologic deficits are appreciated. Skin:  Skin is warm, dry and intact. No rash noted. Psychiatric: Mood and affect are normal. Speech and behavior are normal. Patient exhibits appropriate insight and judgment.   ____________________________________________  LABS (pertinent positives/negatives) I, Governor Rooksebecca Firas Guardado, MD the attending physician have reviewed the labs noted below.  Labs Reviewed  BASIC METABOLIC PANEL - Abnormal; Notable for the following components:      Result Value   Glucose, Bld 106 (*)    All other components within normal limits  CBC WITH DIFFERENTIAL/PLATELET - Abnormal; Notable for the following components:   Neutro Abs 7.4 (*)    All other components within normal limits  TROPONIN I    ____________________________________________    EKG I, Governor Rooksebecca Aubri Gathright, MD, the attending physician have personally viewed and interpreted all ECGs.  70 bpm.  Normal sinus rhythm.  Narrow QRS.  Normal axis.  Normal ST and T  wave ____________________________________________  RADIOLOGY All Xrays were viewed by me.  Imaging interpreted by Radiologist, and I, Governor Rooksebecca Mayu Ronk, MD the attending physician have reviewed the radiologist interpretation noted below.  Chest x-ray:  IMPRESSION: No evidence of acute cardiopulmonary disease. __________________________________________  PROCEDURES  Procedure(s) performed: None  Critical Care performed: None  ____________________________________________  No current facility-administered medications on file prior to encounter.    Current Outpatient Medications on File Prior to Encounter  Medication Sig Dispense Refill  . apixaban (ELIQUIS) 5 MG TABS tablet Take 2 tablets (10 mg total) by mouth 2 (two) times daily. Take 2 tabs PO twice a day for 1 week and then 1 tab PO twice a day after that (Patient taking differently: Take 5 mg by mouth 2 (two) times daily. ) 60 tablet 0  . aspirin  EC 81 MG tablet Take 1 tablet (81 mg total) by mouth daily. 90 tablet 3  . atorvastatin (LIPITOR) 40 MG tablet Take 1 tablet (40 mg total) by mouth daily at 6 PM. 30 tablet 2  . clopidogrel (PLAVIX) 75 MG tablet Take 1 tablet (75 mg total) by mouth daily. 90 tablet 3  . metoprolol tartrate (LOPRESSOR) 25 MG tablet Take 0.5 tablets (12.5 mg total) by mouth 2 (two) times daily. 30 tablet 2  . traZODone (DESYREL) 100 MG tablet Take 1 tablet (100 mg total) by mouth at bedtime. 30 tablet 2    ____________________________________________  ED COURSE / ASSESSMENT AND PLAN  Pertinent labs & imaging results that were available during my care of the patient were reviewed by me and considered in my medical decision making (see chart for details).  Patient is currently asymptomatic, but had episode of intermittent sharp chest pains earlier today.  No associated symptoms.  EKG is reassuring.  Chest pain will over 4 hours ago, and troponin negative, without ongoing symptoms and duration of symptoms  earlier, without associated symptoms, I think is unlikely to be ACS.  Given time from symptom onset, I do not think he needs repeat troponin.  As I was getting ready to discharge the patient, patient had alerted the secretary that he wanted to speak with a psychiatrist.  Patient is now stating that he is having suicidal thoughts.  He is voluntary for evaluation by psychiatry.  Patient will be moved to the behavioral unit to await psychiatry consultation in the morning.  I asked him directly just about half hour previously whether or not he was having any mood issues or depression or suicidal thoughts and he had said no.   DIFFERENTIAL DIAGNOSIS: Differential diagnosis includes, but is not limited to, ACS, aortic dissection, pulmonary embolism, cardiac tamponade, pneumothorax, pneumonia, pericarditis, myocarditis, GI-related causes including esophagitis/gastritis, and musculoskeletal chest wall pain.    CONSULTATIONS:   TTS and psychiatry.   Patient / Family / Caregiver informed of clinical course, medical decision-making process, and agree with plan.   I discussed return precautions, follow-up instructions, and discharge instructions with patient and/or family.  ((( Prepared Discharge Instructions prior to asking for psychiatrist : You are evaluated for chest discomfort, and although no certain cause was found, your exam and evaluation are overall reassuring in the emergency permit today.  Return to the emergency department immediately for any worsening chest pain or associated nausea, sweats, dizziness or passing out, trouble breathing, or any other symptoms concerning to you.  You are referred to follow-up with a primary care doctor, and were given multiple resources in your packet.  I am also asking that you follow with a cardiologist given your history, to consider possible stress testing this week.)))  ___________________________________________   FINAL CLINICAL IMPRESSION(S) / ED  DIAGNOSES   Final diagnoses:  Nonspecific chest pain  Suicidal thoughts              Note: This dictation was prepared with Dragon dictation. Any transcriptional errors that result from this process are unintentional    Governor Rooks, MD 12/18/16 [id number]

## 2016-12-18 NOTE — ED Triage Notes (Signed)
Pt to ED by EMS with c/o of sudden onset chest pain. Pt has hx of MI in Feb 2018. Pt took 324 of aspirin prior to EMS arrival w/o relief and EMS administered 1 nitro in route. Pt states pain decreased but occasional shooting pain.

## 2016-12-18 NOTE — ED Notes (Signed)
Patient pacing and stating he is hearing voices. EDP Dr. Shaune PollackLord notified. Verbal order for po 1mg  ativan received.

## 2016-12-18 NOTE — ED Notes (Signed)
Pt verbalizes thoughts of wanted to hurt himself, Dr.Lord notified, pt does not have a plan, pt dressed in purple scrubs by ED tech, [facility]

## 2016-12-18 NOTE — Discharge Instructions (Signed)
You are evaluated for chest discomfort, and although no certain cause was found, your exam and evaluation are overall reassuring in the emergency permit today.  Return to the emergency department immediately for any worsening chest pain or associated nausea, sweats, dizziness or passing out, trouble breathing, or any other symptoms concerning to you.  You are referred to follow-up with a primary care doctor, and were given multiple resources in your packet.  I am also asking that you follow with a cardiologist given your history, to consider possible stress testing this week.

## 2016-12-19 ENCOUNTER — Inpatient Hospital Stay
Admission: AD | Admit: 2016-12-19 | Discharge: 2016-12-27 | DRG: 885 | Disposition: A | Discharge status: Home / Self Care | Payer: No Typology Code available for payment source | Source: Intra-hospital | Attending: Psychiatry | Admitting: Psychiatry

## 2016-12-19 ENCOUNTER — Other Ambulatory Visit: Payer: Self-pay

## 2016-12-19 DIAGNOSIS — F4310 Post-traumatic stress disorder, unspecified: Secondary | ICD-10-CM | POA: Diagnosis present

## 2016-12-19 DIAGNOSIS — Z7902 Long term (current) use of antithrombotics/antiplatelets: Secondary | ICD-10-CM | POA: Diagnosis not present

## 2016-12-19 DIAGNOSIS — Z79899 Other long term (current) drug therapy: Secondary | ICD-10-CM | POA: Diagnosis not present

## 2016-12-19 DIAGNOSIS — F323 Major depressive disorder, single episode, severe with psychotic features: Principal | ICD-10-CM | POA: Diagnosis present

## 2016-12-19 DIAGNOSIS — G4700 Insomnia, unspecified: Secondary | ICD-10-CM | POA: Diagnosis present

## 2016-12-19 DIAGNOSIS — G2571 Drug induced akathisia: Secondary | ICD-10-CM

## 2016-12-19 DIAGNOSIS — F1210 Cannabis abuse, uncomplicated: Secondary | ICD-10-CM | POA: Diagnosis present

## 2016-12-19 DIAGNOSIS — F17210 Nicotine dependence, cigarettes, uncomplicated: Secondary | ICD-10-CM | POA: Diagnosis present

## 2016-12-19 DIAGNOSIS — F419 Anxiety disorder, unspecified: Secondary | ICD-10-CM | POA: Diagnosis present

## 2016-12-19 DIAGNOSIS — Z7901 Long term (current) use of anticoagulants: Secondary | ICD-10-CM | POA: Diagnosis not present

## 2016-12-19 DIAGNOSIS — R45851 Suicidal ideations: Secondary | ICD-10-CM

## 2016-12-19 DIAGNOSIS — F251 Schizoaffective disorder, depressive type: Secondary | ICD-10-CM

## 2016-12-19 DIAGNOSIS — Z7982 Long term (current) use of aspirin: Secondary | ICD-10-CM | POA: Diagnosis not present

## 2016-12-19 DIAGNOSIS — F29 Unspecified psychosis not due to a substance or known physiological condition: Secondary | ICD-10-CM

## 2016-12-19 DIAGNOSIS — I10 Essential (primary) hypertension: Secondary | ICD-10-CM | POA: Diagnosis present

## 2016-12-19 DIAGNOSIS — R12 Heartburn: Secondary | ICD-10-CM | POA: Diagnosis not present

## 2016-12-19 DIAGNOSIS — F1110 Opioid abuse, uncomplicated: Secondary | ICD-10-CM | POA: Diagnosis present

## 2016-12-19 DIAGNOSIS — Z915 Personal history of self-harm: Secondary | ICD-10-CM

## 2016-12-19 DIAGNOSIS — I252 Old myocardial infarction: Secondary | ICD-10-CM | POA: Diagnosis not present

## 2016-12-19 MED ORDER — IBUPROFEN 800 MG PO TABS
800.0000 mg | ORAL_TABLET | Freq: Once | ORAL | Status: AC
  Administered 2016-12-19: 800 mg via ORAL

## 2016-12-19 MED ORDER — BENZTROPINE MESYLATE 1 MG PO TABS
1.0000 mg | ORAL_TABLET | Freq: Two times a day (BID) | ORAL | Status: DC
  Administered 2016-12-19 – 2016-12-27 (×16): 1 mg via ORAL
  Filled 2016-12-19 (×16): qty 1

## 2016-12-19 MED ORDER — HYDROXYZINE HCL 50 MG PO TABS
50.0000 mg | ORAL_TABLET | Freq: Three times a day (TID) | ORAL | Status: DC | PRN
  Administered 2016-12-19 – 2016-12-27 (×7): 50 mg via ORAL
  Filled 2016-12-19 (×8): qty 1

## 2016-12-19 MED ORDER — LORAZEPAM 1 MG PO TABS
1.0000 mg | ORAL_TABLET | Freq: Once | ORAL | Status: AC
  Administered 2016-12-19: 1 mg via ORAL
  Filled 2016-12-19: qty 1

## 2016-12-19 MED ORDER — TRAZODONE HCL 50 MG PO TABS
150.0000 mg | ORAL_TABLET | Freq: Every day | ORAL | Status: DC
  Administered 2016-12-19 – 2016-12-21 (×3): 150 mg via ORAL
  Filled 2016-12-19 (×4): qty 1

## 2016-12-19 MED ORDER — ZIPRASIDONE HCL 20 MG PO CAPS: 20.0000 mg | ORAL_CAPSULE | Freq: Two times a day (BID) | ORAL | Status: DC

## 2016-12-19 MED ORDER — ACETAMINOPHEN 325 MG PO TABS
650.0000 mg | ORAL_TABLET | Freq: Four times a day (QID) | ORAL | Status: DC | PRN
  Administered 2016-12-19 – 2016-12-27 (×20): 650 mg via ORAL
  Filled 2016-12-19 (×20): qty 2

## 2016-12-19 MED ORDER — TRAZODONE HCL 100 MG PO TABS: 100.0000 mg | ORAL_TABLET | Freq: Every evening | ORAL | Status: DC | PRN

## 2016-12-19 MED ORDER — ALUM & MAG HYDROXIDE-SIMETH 200-200-20 MG/5ML PO SUSP
30.0000 mL | ORAL | Status: DC | PRN
  Administered 2016-12-20 – 2016-12-21 (×2): 30 mL via ORAL
  Filled 2016-12-19 (×2): qty 30

## 2016-12-19 MED ORDER — IBUPROFEN 800 MG PO TABS
800.0000 mg | ORAL_TABLET | Freq: Once | ORAL | Status: AC
  Administered 2016-12-19: 800 mg via ORAL
  Filled 2016-12-19: qty 1

## 2016-12-19 MED ORDER — IBUPROFEN 800 MG PO TABS
ORAL_TABLET | ORAL | Status: AC
  Administered 2016-12-19: 800 mg via ORAL
  Filled 2016-12-19: qty 1

## 2016-12-19 MED ORDER — BENZTROPINE MESYLATE 1 MG PO TABS: 1.0000 mg | ORAL_TABLET | Freq: Two times a day (BID) | ORAL | Status: DC

## 2016-12-19 MED ORDER — BENZTROPINE MESYLATE 1 MG PO TABS
1.0000 mg | ORAL_TABLET | Freq: Once | ORAL | Status: AC
  Administered 2016-12-19: 1 mg via ORAL
  Filled 2016-12-19: qty 1

## 2016-12-19 MED ORDER — MAGNESIUM HYDROXIDE 400 MG/5ML PO SUSP: 30.0000 mL | Freq: Every day | ORAL | Status: DC | PRN

## 2016-12-19 MED ORDER — ZIPRASIDONE HCL 20 MG PO CAPS
20.0000 mg | ORAL_CAPSULE | Freq: Two times a day (BID) | ORAL | Status: DC
  Administered 2016-12-19 – 2016-12-20 (×2): 20 mg via ORAL
  Filled 2016-12-19 (×2): qty 1

## 2016-12-19 NOTE — ED Notes (Signed)
Patient asking for different bed. This Clinical research associatewriter explained to patient there is not other mattresses here and all the beds in this area are like the one on his bed.

## 2016-12-19 NOTE — Tx Team (Signed)
Initial Treatment Plan 12/19/2016 5:03 PM James Siasavid W Bole [id number]    PATIENT STRESSORS: Medication change or noncompliance Other: Life   PATIENT STRENGTHS: Ability for insight Capable of independent living Communication skills Physical Health Supportive family/friends   PATIENT IDENTIFIED PROBLEMS: Dealing with life  Put back on his medications that [facility] took him off, such as Xanax                   DISCHARGE CRITERIA:  Ability to meet basic life and health needs Adequate post-discharge living arrangements Improved stabilization in mood, thinking, and/or behavior  PRELIMINARY DISCHARGE PLAN: Return to previous living arrangement  PATIENT/FAMILY INVOLVEMENT: This treatment plan has been presented to and reviewed with the patient, James Abbott, The patient have been given the opportunity to ask questions and make suggestions.  Rex KrasJoanne  Shiane Wenberg, RN 12/19/2016, 5:03 PM

## 2016-12-19 NOTE — ED Notes (Signed)
Patient complaining of leg pain 8/10 EDP Dr. Dolores FrameSung made aware. Verbal order for 800 ibuprofen PO one time order received.

## 2016-12-19 NOTE — Progress Notes (Signed)
Pt A & O X4. Denies SI, HI, AVH and pain when assessed. Transferred to BMU as per MD's orders. Vitals done, WNL and and documented. Remains medication compliant. Report called to receiving nurse, Chyrl CivatteJoann, RN. Pt cooperative with transfer procedure. All belongings transferred with pt to BMU. Q 15 minutes safety checks continues without self harm gestures.

## 2016-12-19 NOTE — Progress Notes (Signed)
Received James Abbott from the ED, alert and oriented x4. He is restless related to his RLS. He stated his chief compliant is panic attacks, medication adjustments [facility] and life stressors. He lives with his mother and daughter who is 8910 years old. His skin assessment was completed with nurse Jamesetta Sohyllis being a witness. He was oriented to his new environment and dinner was ordered for him.

## 2016-12-19 NOTE — BH Assessment (Signed)
Assessment Note  James Abbott is an 46 y.o. male presenting to the ED initially for chest pains but later expressed concerns with suicidal ideations, depression and anxiety.  Pt denies having a plan.  He reports auditory hallucinations telling him to hurt himself but he denies intent.  He admits to occasional drug use.  Pt's UDS + for cannabis and opiates.  Diagnosis: Anxiety  Past Medical History:  Past Medical History:  Diagnosis Date  . Hypertension   . MI (myocardial infarction) [facility](HCC)     Past Surgical History:  Procedure Laterality Date  . CARDIAC CATHETERIZATION      Family History:  Family History  Problem Relation Age of Onset  . Heart attack Maternal Uncle   . Heart attack Paternal Uncle     Social History:  reports that he has been smoking cigarettes.  He has a 7.50 pack-year smoking history. he has never used smokeless tobacco. He reports that he does not drink alcohol or use drugs.  Additional Social History:  Alcohol / Drug Use Pain Medications: See PTA Prescriptions: See PTA Over the Counter: See PTA History of alcohol / drug use?: Yes Negative Consequences of Use: Financial, Legal Substance #1 Name of Substance 1: Cannabis Substance #2 Name of Substance 2: Opiates  CIWA: CIWA-Ar BP: 114/69 Pulse Rate: 74 COWS:    Allergies:  Allergies  Allergen Reactions  . Fluoxetine   . Haloperidol And Related     Home Medications:  (Not in a hospital admission)  OB/GYN Status:  No LMP for male patient.  General Assessment Data [facility] Assessment: In system Is this a Tele or Face-to-Face Assessment?: Face-to-Face Is this an Initial Assessment or a Re-assessment for this encounter?: Initial Assessment Marital status: Single Maiden name: n/a Is patient pregnant?: Other (Comment) Pregnancy Status: Other (Comment) Living Arrangements: Children, Other relatives Can pt return to current living arrangement?: Yes Admission Status:  Voluntary Is patient capable of signing voluntary admission?: Yes Referral Source: Self/Family/Friend Insurance type: none     Crisis Care Plan Living Arrangements: Children, Other relatives Legal Guardian: Other:(self) Name of Psychiatrist: none reported Name of Therapist: none reported  Education Status Is patient currently in school?: No Current Grade: na Highest grade of school patient has completed: hs Name of school: na Contact person: na  Risk to self with the past 6 months Suicidal Ideation: Yes-Currently Present Has patient been a risk to self within the past 6 months prior to admission? : No Suicidal Intent: No Has patient had any suicidal intent within the past 6 months prior to admission? : No Is patient at risk for suicide?: No Suicidal Plan?: No Has patient had any suicidal plan within the past 6 months prior to admission? : No Access to Means: No What has been your use of drugs/alcohol within the last 12 months?: cannabis and opiates Previous Attempts/Gestures: Yes How many times?: 1 Other Self Harm Risks: drug addiction Triggers for Past Attempts: None known Intentional Self Injurious Behavior: None Family Suicide History: No Recent stressful life event(s): Recent negative physical changes Persecutory voices/beliefs?: No Depression: Yes Depression Symptoms: Loss of interest in usual pleasures, Feeling angry/irritable Substance abuse history and/or treatment for substance abuse?: Yes Suicide prevention information given to non-admitted patients: Not applicable  Risk to Others within the past 6 months Homicidal Ideation: No Does patient have any lifetime risk of violence toward others beyond the six months prior to admission? : No Thoughts of Harm to Others: No Current  Homicidal Intent: No Current Homicidal Plan: No Access to Homicidal Means: No Identified Victim: none identified History of harm to others?: No Assessment of Violence: None  Noted Violent Behavior Description: none identified Does patient have access to weapons?: No Criminal Charges Pending?: No Does patient have a court date: No Is patient on probation?: No  Psychosis Hallucinations: None noted Delusions: None noted  Mental Status Report Appearance/Hygiene: In scrubs Eye Contact: Fair Motor Activity: Freedom of movement, Restlessness Speech: Logical/coherent Level of Consciousness: Restless Mood: Anxious, Helpless Affect: Apprehensive, Anxious Anxiety Level: Minimal Thought Processes: Relevant Judgement: Partial Orientation: Person, Place, Time, Situation Obsessive Compulsive Thoughts/Behaviors: None  Cognitive Functioning Concentration: Normal Memory: Recent Intact, Remote Intact IQ: Average Insight: Fair Impulse Control: Fair Appetite: Fair Sleep: No Change Vegetative Symptoms: None  ADLScreening [facility] Assessment Services) Patient's cognitive ability adequate to safely complete daily activities?: Yes Patient able to express need for assistance with ADLs?: Yes Independently performs ADLs?: Yes (appropriate for developmental age)  Prior Inpatient Therapy Prior Inpatient Therapy: No Prior Therapy Dates: na Prior Therapy Facilty/Provider(s): na Reason for Treatment: na  Prior Outpatient Therapy Prior Outpatient Therapy: No Prior Therapy Dates: na Prior Therapy Facilty/Provider(s): na Reason for Treatment: na Does patient have an ACCT team?: No Does patient have Intensive In-House Services?  : No Does patient have [facility] services? : No Does patient have P4CC services?: No  ADL Screening (condition at time of admission) Patient's cognitive ability adequate to safely complete daily activities?: Yes Patient able to express need for assistance with ADLs?: Yes Independently performs ADLs?: Yes (appropriate for developmental age)       Abuse/Neglect Assessment (Assessment to be complete while patient is alone) Abuse/Neglect  Assessment Can Be Completed: Yes Physical Abuse: Denies Verbal Abuse: Denies Sexual Abuse: Denies Exploitation of patient/patient's resources: Denies Self-Neglect: Denies Values / Beliefs Cultural Requests During Hospitalization: None Spiritual Requests During Hospitalization: None Consults Spiritual Care Consult Needed: No Social Work Consult Needed: No Merchant navy officerAdvance Directives (For Healthcare) Does Patient Have a Medical Advance Directive?: No Would patient like information on creating a medical advance directive?: No - Patient declined    Additional Information 1:1 In Past 12 Months?: No CIRT Risk: No Elopement Risk: No Does patient have medical clearance?: Yes     Disposition:  Disposition Initial Assessment Completed for this Encounter: Yes Disposition of Patient: Pending Review with psychiatrist  On Site Evaluation by:   Reviewed with Physician:    James Abbott 12/19/2016 6:44 AM

## 2016-12-19 NOTE — Consult Note (Signed)
[facility]   Reason for Consult: Consult for 46 year old man with a history of mental health problems and substance abuse who is now reporting suicidal ideation Referring Physician: Reita Cliche Patient Identification: James Abbott MRN:  [id number] Principal Diagnosis: Schizoaffective disorder, depressive type [facility]) Diagnosis:   Patient Active Problem List   Diagnosis Date Noted  . Akathisia [G25.71] 12/19/2016  . Suicidal ideation [R45.851] 12/19/2016  . Psychosis ([place]) [F29] 12/19/2016  . Schizoaffective disorder, depressive type ([place]) [F25.1] 12/19/2016  . PTSD (post-traumatic stress disorder) [F43.10] 03/25/2016  . Marijuana abuse [F12.10] 03/25/2016  . Noncompliance [Z91.19] 03/25/2016  . History of cocaine abuse [Z87.898] 03/25/2016  . Non-ST elevation (NSTEMI) myocardial infarction ([place]) [I21.4]   . Sepsis ([place]) [A41.9] 03/23/2016    Total Time spent with patient: 1 hour  Subjective:   James Abbott is a 46 y.o. male patient admitted with "the voices are telling me that I should kill myself.  Also help with my restless legs".  HPI: This is a 46 year old man who has a somewhat complicated and confusing past history.  He came to the emergency room initially complaining of chest pain which was worked up with no finding but before discharge he told the doctor he was having suicidal thoughts.  His chief complaint when I first saw him was of what he calls restless legs which however appears to clearly be akathisia.  He says that he cannot sit still and he is constantly pacing around and feels like his legs are uncomfortable if he tries to be still and this is been going on for a couple of weeks.  He says he thinks the chest pain now was a panic attack.  He denies any current chest pain.  He does say that his mood is anxious and somewhat depressed most of the time and also says that he is having the hallucinations.  He says he has auditory hallucinations frequently and that  they tell him that he should kill himself.  He clarifies to me that he does not actually want to kill himself is but the hallucinations telling him to.  Patient does not report any new specific stressor.  He has been on the Risperdal just for the last couple weeks since he was at [facility] inpatient.  He is not sleeping particularly well.  Appetite is okay.  Social history: Patient is living with his mother and his daughter.  He is not on disability.  Works odd jobs.  Medical history: Patient has a past history of hypertension is supposed past history of MI and for reasons that are not entirely clear to me he was on anticoagulant medicines in the past.  According to the most recent notes at [facility] he is no longer on anticoagulant medicines and the only possible medicines for hypertension were propranolol which I had presumed was for the akathisia.  Does not seem to be on any other medicines medically.  Substance abuse history: Patient claims to me that he has not been drinking alcohol and has not been using any drugs at all.  I went through a whole list of drugs and he denied all of them.  His drug screen is positive for cannabis and opiates.  His drug screen at [facility] was positive as well.  Patient has a history of abuse of drugs in the past and is not a very good historian about it.  Past Psychiatric History: He had an inpatient hospitalization at [facility] psychiatric ward just earlier in October.  They diagnosed him with schizoaffective disorder and treated him with Risperdal.  I have seen this gentleman once previously in consultation and thought at that time he had PTSD although the symptoms he is describing now do not particularly sound like PTSD.  He says he has been on multiple medications and that he specifically thinks that Geodon was helpful with his hallucinations.  Interestingly I do not see any mention of the Geodon and his notes from [facility] and when I saw him about a year ago he was claiming the only medicines  that helped him more benzodiazepines.  He denies ever having actually tried to kill himself or been violent with other people.  Not following up with outpatient provider  Risk to Self: Suicidal Ideation: Yes-Currently Present Suicidal Intent: No Is patient at risk for suicide?: No Suicidal Plan?: No Access to Means: No What has been your use of drugs/alcohol within the last 12 months?: cannabis and opiates How many times?: 1 Other Self Harm Risks: drug addiction Triggers for Past Attempts: None known Intentional Self Injurious Behavior: None Risk to Others: Homicidal Ideation: No Thoughts of Harm to Others: No Current Homicidal Intent: No Current Homicidal Plan: No Access to Homicidal Means: No Identified Victim: none identified History of harm to others?: No Assessment of Violence: None Noted Violent Behavior Description: none identified Does patient have access to weapons?: No Criminal Charges Pending?: No Does patient have a court date: No Prior Inpatient Therapy: Prior Inpatient Therapy: No Prior Therapy Dates: na Prior Therapy Facilty/Provider(s): na Reason for Treatment: na Prior Outpatient Therapy: Prior Outpatient Therapy: No Prior Therapy Dates: na Prior Therapy Facilty/Provider(s): na Reason for Treatment: na Does patient have an ACCT team?: No Does patient have Intensive In-House Services?  : No Does patient have [facility] services? : No Does patient have P4CC services?: No  Past Medical History:  Past Medical History:  Diagnosis Date  . Hypertension   . MI (myocardial infarction) [facility])     Past Surgical History:  Procedure Laterality Date  . CARDIAC CATHETERIZATION     Family History:  Family History  Problem Relation Age of Onset  . Heart attack Maternal Uncle   . Heart attack Paternal Uncle    Family Psychiatric  History: Says he does not know of any family history Social History:  Social History   Substance and Sexual Activity  Alcohol Use No      Social History   Substance and Sexual Activity  Drug Use No    Social History   Socioeconomic History  . Marital status: Single    Spouse name: None  . Number of children: None  . Years of education: None  . Highest education level: None  Social Needs  . Financial resource strain: None  . Food insecurity - worry: None  . Food insecurity - inability: None  . Transportation needs - medical: None  . Transportation needs - non-medical: None  Occupational History  . None  Tobacco Use  . Smoking status: Current Every Day Smoker    Packs/day: 0.25    Years: 30.00    Pack years: 7.50    Types: Cigarettes  . Smokeless tobacco: Never Used  Substance and Sexual Activity  . Alcohol use: No  . Drug use: No  . Sexual activity: None  Other Topics Concern  . None  Social History Narrative  . None   Additional Social History:    Allergies:   Allergies  Allergen Reactions  . Fluoxetine   .  Haloperidol And Related     Labs:  Results for orders placed or performed during the hospital encounter of 12/18/16 (from the past 48 hour(s))  Basic metabolic panel     Status: Abnormal   Collection Time: 12/18/16  3:05 PM  Result Value Ref Range   Sodium 135 135 - 145 mmol/L   Potassium 4.2 3.5 - 5.1 mmol/L   Chloride 103 101 - 111 mmol/L   CO2 26 22 - 32 mmol/L   Glucose, Bld 106 (H) 65 - 99 mg/dL   BUN 14 6 - 20 mg/dL   Creatinine, Ser 0.69 0.61 - 1.24 mg/dL   Calcium 9.0 8.9 - 10.3 mg/dL   GFR calc non Af Amer >60 >60 mL/min   GFR calc Af Amer >60 >60 mL/min    Comment: (NOTE) The eGFR has been calculated using the CKD EPI equation. This calculation has not been validated in all clinical situations. eGFR's persistently <60 mL/min signify possible Chronic Kidney Disease.    Anion gap 6 5 - 15  Troponin I     Status: None   Collection Time: 12/18/16  3:05 PM  Result Value Ref Range   Troponin I <0.03 <0.03 ng/mL  CBC with Differential     Status: Abnormal   Collection  Time: 12/18/16  3:05 PM  Result Value Ref Range   WBC 10.4 3.8 - 10.6 K/uL   RBC 4.53 4.40 - 5.90 MIL/uL   Hemoglobin 14.0 13.0 - 18.0 g/dL   HCT 40.6 40.0 - 52.0 %   MCV 89.5 80.0 - 100.0 fL   MCH 30.9 26.0 - 34.0 pg   MCHC 34.5 32.0 - 36.0 g/dL   RDW 13.2 11.5 - 14.5 %   Platelets 309 150 - 440 K/uL   Neutrophils Relative % 71 %   Neutro Abs 7.4 (H) 1.4 - 6.5 K/uL   Lymphocytes Relative 17 %   Lymphs Abs 1.8 1.0 - 3.6 K/uL   Monocytes Relative 6 %   Monocytes Absolute 0.7 0.2 - 1.0 K/uL   Eosinophils Relative 5 %   Eosinophils Absolute 0.5 0 - 0.7 K/uL   Basophils Relative 1 %   Basophils Absolute 0.1 0 - 0.1 K/uL  Urine Drug Screen, Qualitative ([facility] only)     Status: Abnormal   Collection Time: 12/18/16  9:40 PM  Result Value Ref Range   Tricyclic, Ur Screen NONE DETECTED NONE DETECTED   Amphetamines, Ur Screen NONE DETECTED NONE DETECTED   MDMA (Ecstasy)Ur Screen NONE DETECTED NONE DETECTED   Cocaine Metabolite,Ur [place] NONE DETECTED NONE DETECTED   Opiate, Ur Screen POSITIVE (A) NONE DETECTED   Phencyclidine (PCP) Ur S NONE DETECTED NONE DETECTED   Cannabinoid 50 Ng, Ur [place] POSITIVE (A) NONE DETECTED   Barbiturates, Ur Screen NONE DETECTED NONE DETECTED   Benzodiazepine, Ur Scrn NONE DETECTED NONE DETECTED   Methadone Scn, Ur NONE DETECTED NONE DETECTED    Comment: (NOTE) 161  Tricyclics, urine               Cutoff 1000 ng/mL 200  Amphetamines, urine             Cutoff 1000 ng/mL 300  MDMA (Ecstasy), urine           Cutoff 500 ng/mL 400  Cocaine Metabolite, urine       Cutoff 300 ng/mL 500  Opiate, urine                   Cutoff 300  ng/mL 600  Phencyclidine (PCP), urine      Cutoff 25 ng/mL 700  Cannabinoid, urine              Cutoff 50 ng/mL 800  Barbiturates, urine             Cutoff 200 ng/mL 900  Benzodiazepine, urine           Cutoff 200 ng/mL 1000 Methadone, urine                Cutoff 300 ng/mL 1100 1200 The urine drug screen provides only a preliminary,  unconfirmed 1300 analytical test result and should not be used for non-medical 1400 purposes. Clinical consideration and professional judgment should 1500 be applied to any positive drug screen result due to possible 1600 interfering substances. A more specific alternate chemical method 1700 must be used in order to obtain a confirmed analytical result.  1800 Gas chromato graphy / mass spectrometry (GC/MS) is the preferred 1900 confirmatory method.     Current Facility-Administered Medications  Medication Dose Route Frequency Provider Last Rate Last Dose  . nicotine (NICODERM CQ - dosed in mg/24 hours) patch 21 mg  21 mg Transdermal Once Delman Kitten, MD   21 mg at 12/18/16 2041  . traZODone (DESYREL) tablet 150 mg  150 mg Oral QHS Delman Kitten, MD   150 mg at 12/18/16 2205   Current Outpatient Medications  Medication Sig Dispense Refill  . apixaban (ELIQUIS) 5 MG TABS tablet Take 2 tablets (10 mg total) by mouth 2 (two) times daily. Take 2 tabs PO twice a day for 1 week and then 1 tab PO twice a day after that (Patient taking differently: Take 5 mg by mouth 2 (two) times daily. ) 60 tablet 0  . gabapentin (NEURONTIN) 300 MG capsule Take 300 mg 3 (three) times daily by mouth.    . metoprolol tartrate (LOPRESSOR) 25 MG tablet Take 0.5 tablets (12.5 mg total) by mouth 2 (two) times daily. 30 tablet 2  . propranolol (INDERAL) 40 MG tablet Take 40 mg daily by mouth.    . risperidone (RISPERDAL) 4 MG tablet Take 4 mg 2 (two) times daily by mouth.    . traZODone (DESYREL) 100 MG tablet Take 1 tablet (100 mg total) by mouth at bedtime. 30 tablet 2  . aspirin EC 81 MG tablet Take 1 tablet (81 mg total) by mouth daily. (Patient not taking: Reported on 12/18/2016) 90 tablet 3  . atorvastatin (LIPITOR) 40 MG tablet Take 1 tablet (40 mg total) by mouth daily at 6 PM. (Patient not taking: Reported on 12/18/2016) 30 tablet 2  . clopidogrel (PLAVIX) 75 MG tablet Take 1 tablet (75 mg total) by mouth daily.  (Patient not taking: Reported on 12/18/2016) 90 tablet 3    Musculoskeletal: Strength & Muscle Tone: within normal limits Gait & Station: Akathetic Patient leans: N/A  Psychiatric Specialty Exam: Physical Exam  Nursing note and vitals reviewed. Constitutional: He appears well-developed and well-nourished.  HENT:  Head: Normocephalic and atraumatic.  Eyes: Conjunctivae are normal. Pupils are equal, round, and reactive to light.  Neck: Normal range of motion.  Cardiovascular: Normal rate and normal heart sounds.  Respiratory: Effort normal. No respiratory distress.  GI: Soft.  Musculoskeletal: Normal range of motion.  Neurological: He is alert.  Patient is pacing constantly moving his legs around describing symptoms typical of akathisia  Skin: Skin is warm and dry.  Psychiatric: Judgment normal. His mood appears anxious. His affect is blunt.  His speech is delayed. He is slowed and withdrawn. Thought content is paranoid. Cognition and memory are impaired. He expresses suicidal ideation. He expresses no suicidal plans.    Review of Systems  Constitutional: Negative.   HENT: Negative.   Eyes: Negative.   Respiratory: Negative.   Cardiovascular: Negative.   Gastrointestinal: Negative.   Musculoskeletal: Negative.   Skin: Negative.   Neurological: Negative.        Complaining of symptoms very typical of general akathisia  Psychiatric/Behavioral: Positive for depression, hallucinations and suicidal ideas. Negative for substance abuse. The patient is nervous/anxious and has insomnia.     Blood pressure 114/69, pulse 74, temperature 98.5 F (36.9 C), temperature source Oral, resp. rate 18, height 5' 8"  (1.727 m), weight 76.2 kg (168 lb), SpO2 100 %.Body mass index is 25.54 kg/m.  General Appearance: Fairly Groomed  Eye Contact:  Good  Speech:  Clear and Coherent  Volume:  Normal  Mood:  Anxious  Affect:  Congruent  Thought Process:  Goal Directed  Orientation:  Full (Time, Place,  and Person)  Thought Content:  Hallucinations: Auditory  Suicidal Thoughts:  Yes.  without intent/plan  Homicidal Thoughts:  No  Memory:  Immediate;   Fair Recent;   Fair Remote;   Fair  Judgement:  Fair  Insight:  Fair  Psychomotor Activity:  Restlessness  Concentration:  Concentration: Fair  Recall:  [employer] of Knowledge:  Fair  Language:  Fair  Akathisia:  Yes  Handed:  Right  AIMS (if indicated):     Assets:  Desire for Improvement Physical Health Resilience  ADL's:  Intact  Cognition:  WNL  Sleep:        Treatment Plan Summary: Daily contact with patient to assess and evaluate symptoms and progress in treatment, Medication management and Plan During the time I spoke with him he really did appear to be having akathisia.  He matched up with his history of being on a fairly large dose of Risperdal.  We will discontinue the Risperdal and I have given him 1 time orders for some Ativan and Cogentin.  Patient is saying that Geodon has been helpful in the past.  I reviewed his EKG and his QTC is in the reasonable range so we can start Geodon.  The patient's whole history is a bit confusing.  He is not being completely honest about his substance abuse and that should be addressed.  Labs reviewed old notes from [facility] reviewed.  Patient will be admitted to the psychiatric ward continue 15-minute checks.  Disposition: Recommend psychiatric Inpatient admission when medically cleared. Supportive therapy provided about ongoing stressors.  Alethia Berthold, MD 12/19/2016 1:15 PM

## 2016-12-19 NOTE — ED Provider Notes (Signed)
-----------------------------------------   7:57 AM on 12/19/2016 -----------------------------------------   Blood pressure 114/69, pulse 74, temperature 98.5 F (36.9 C), temperature source Oral, resp. rate 18, height 5\' 8"  (1.727 m), weight 76.2 kg (168 lb), SpO2 100 %.  The patient had no acute events since last update.  Calm and cooperative at this time. Ibuprofen given for musculoskeletal leg pains.  Disposition is pending Psychiatry/Behavioral Medicine team recommendations.     Irean HongSung, Kylia Grajales J, MD 12/19/16 [phone]

## 2016-12-19 NOTE — BH Assessment (Signed)
Pt to be admitted to BMU per Dr.Clapacs. Pt pending bed assignment. Support papers signed and placed on pt chart.

## 2016-12-19 NOTE — BH Assessment (Signed)
[facility] by Dr. Toni Amendlapacs.  Attending Physician will be Dr. Johnella MoloneyMcNew.   Patient has been [facility] to room 316A, [facility] Charge Nurse [place].   ER staff is aware of the admission Elder Negus( Emile, ER Sect.; Dr. Lenard LancePaduchowski, ER MD; Lincoln Maxinlivette, Patient's Nurse & Josh Patient Access).

## 2016-12-19 NOTE — ED Provider Notes (Signed)
-----------------------------------------   11:52 AM on 12/19/2016 -----------------------------------------  Patient has been seen by psychiatry, they believe the patient is in need of further treatment and will be admitting to their service once a bed becomes available.   Minna AntisPaduchowski, Mayci Haning, MD 12/19/16 1152

## 2016-12-20 DIAGNOSIS — F251 Schizoaffective disorder, depressive type: Secondary | ICD-10-CM

## 2016-12-20 LAB — LIPID PANEL
CHOLESTEROL: 290 mg/dL — AB (ref 0–200)
HDL: 42 mg/dL (ref >40)
LDL Cholesterol: 215 mg/dL — ABNORMAL HIGH (ref 0–99)
Total CHOL/HDL Ratio: 6.9 RATIO
Triglycerides: 165 mg/dL — ABNORMAL HIGH (ref <150)
VLDL: 33 mg/dL (ref 0–40)

## 2016-12-20 LAB — HEMOGLOBIN A1C
HEMOGLOBIN A1C: 5.7 % — AB (ref 4.8–5.6)
MEAN PLASMA GLUCOSE: 116.89 mg/dL

## 2016-12-20 LAB — TSH: TSH: 1.294 u[IU]/mL (ref 0.350–4.500)

## 2016-12-20 MED ORDER — LORAZEPAM 1 MG PO TABS
1.0000 mg | ORAL_TABLET | Freq: Once | ORAL | Status: AC
  Administered 2016-12-20: 1 mg via ORAL
  Filled 2016-12-20: qty 1

## 2016-12-20 MED ORDER — OLANZAPINE 5 MG PO TABS
5.0000 mg | ORAL_TABLET | Freq: Two times a day (BID) | ORAL | Status: DC
  Administered 2016-12-20 – 2016-12-21 (×3): 5 mg via ORAL
  Filled 2016-12-20 (×3): qty 1

## 2016-12-20 MED ORDER — NICOTINE 21 MG/24HR TD PT24
21.0000 mg | MEDICATED_PATCH | Freq: Every day | TRANSDERMAL | Status: DC
  Administered 2016-12-20 – 2016-12-27 (×8): 21 mg via TRANSDERMAL
  Filled 2016-12-20 (×7): qty 1

## 2016-12-20 MED ORDER — GABAPENTIN 600 MG PO TABS
300.0000 mg | ORAL_TABLET | Freq: Three times a day (TID) | ORAL | Status: DC
  Administered 2016-12-20 – 2016-12-23 (×10): 300 mg via ORAL
  Filled 2016-12-20 (×10): qty 1

## 2016-12-20 MED ORDER — LORAZEPAM 1 MG PO TABS
1.0000 mg | ORAL_TABLET | Freq: Four times a day (QID) | ORAL | Status: DC | PRN
  Administered 2016-12-20 – 2016-12-23 (×8): 1 mg via ORAL
  Filled 2016-12-20 (×8): qty 1

## 2016-12-20 MED ORDER — PROPRANOLOL HCL 20 MG PO TABS
20.0000 mg | ORAL_TABLET | Freq: Three times a day (TID) | ORAL | Status: DC
  Administered 2016-12-20 – 2016-12-23 (×10): 20 mg via ORAL
  Filled 2016-12-20 (×10): qty 1

## 2016-12-20 NOTE — Plan of Care (Signed)
Milieu remains safe, patient is compliant with medications and meals.

## 2016-12-20 NOTE — Progress Notes (Signed)
Recreation Therapy Notes  Date: 11.09.18  Time: 9:30 am  Location: Craft Room  Behavioral response: Appropriate  Intervention Topic: Communication  Discussion/Intervention: Group content on today was focused on communication. The group defined communication and described healthy ways they communicate. Individuals expressed ways they have dealt with communication in the past. Patients identified the key components in communication and the most important part of communication. The group participated in the intervention "Board Scramble" patient were broken into groups had to communicate with one another to unscramble the words on the board.  Clinical Observations/Feedback:  Patient came to group and was focused on the group content as well as what staff and peers had to say about the topic at hand. Individual was positive with peers and staff while completing the intervention. Sheilla Maris LRT/CTRS         Ly Wass 12/20/2016 2:26 PM

## 2016-12-20 NOTE — Progress Notes (Signed)
Patient appear disorganized but cooperative, takes his medicines and have some interactions with peers at the milieu and with appropriate behaviors, contract safety denies any thoughts of SI/HI and AVH at this time.

## 2016-12-20 NOTE — Progress Notes (Signed)
Recreation Therapy Notes  INPATIENT RECREATION THERAPY ASSESSMENT  Patient Details Name: James SiasDavid W Abbott MRN: [id number] DOB: 31-May-1970 Today's Date: 12/20/2016  Patient Stressors: Other (Comment)(Hearing voices)  Coping Skills:   Exercise  Personal Challenges: Communication, Concentration, Decision-Making, Expressing Yourself, Problem-Solving, Self-Esteem/Confidence, Social Interaction, Stress Management, Trusting Others  Leisure Interests (2+):  Social - Family  Awareness of Community Resources:  Yes  Community Resources:    None Current Use: No  If no, Barriers?: Social  Patient Strengths:  Being a Dad  Patient Identified Areas of Improvement:  Social interactions  Current Recreation Participation:  Exercise  Patient Goal for Hospitalization:  Getting my restless legs fixed   [place]  [place]idence[place]   Current [place] (including self-harm):  No Patient stated he is not suicidal but the voices are.  Current HI:  Yes  Consent to Intern Participation: N/A   [name] 12/20/2016, 11:58 AM
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
## 2016-12-20 NOTE — Tx Team (Signed)
Interdisciplinary Treatment and Diagnostic Plan Update  12/20/2016 Time of Session: [name] MRN: [id number]  Principal Diagnosis: <principal problem not specified>  Secondary Diagnoses: Active Problems:   Schizoaffective disorder, depressive type (HCC)   Current Medications:  Current Facility-Administered Medications  Medication Dose Route Frequency Provider Last Rate Last Dose  . acetaminophen (TYLENOL) tablet 650 mg  650 mg Oral Q6H PRN Clapacs, [name], MD   650 mg at 12/20/16 0820  . alum & mag hydroxide-simeth (MAALOX/MYLANTA) 200-200-20 MG/5ML suspension 30 mL  30 mL Oral Q4H PRN Clapacs, [name], MD      . benztropine (COGENTIN) tablet 1 mg  1 mg Oral BID Clapacs, [name], MD   1 mg at 12/20/16 0820  . gabapentin (NEURONTIN) tablet 300 mg  300 mg Oral TID [name], MD   300 mg at 12/20/16 1205  . hydrOXYzine (ATARAX/VISTARIL) tablet 50 mg  50 mg Oral TID PRN Clapacs, [name], MD   50 mg at 12/19/16 1745  . LORazepam (ATIVAN) tablet 1 mg  1 mg Oral Q6H PRN McNew, [name], MD      . magnesium hydroxide (MILK OF MAGNESIA) suspension 30 mL  30 mL Oral Daily PRN Clapacs, [name], MD      . nicotine (NICODERM CQ - dosed in mg/24 hours) patch 21 mg  21 mg Transdermal Daily [name], [name], MD   21 mg at 12/20/16 0820  . OLANZapine (ZYPREXA) tablet 5 mg  5 mg Oral BID McNew, [name], MD   5 mg at 12/20/16 1205  . propranolol (INDERAL) tablet 20 mg  20 mg Oral TID [name], MD   20 mg at 12/20/16 1205  . traZODone (DESYREL) tablet 100 mg  100 mg Oral QHS PRN Clapacs, [name], MD      . traZODone (DESYREL) tablet 150 mg  150 mg Oral QHS Clapacs, [name], MD   150 mg at 12/19/16 2131   PTA Medications: Medications Prior to Admission  Medication Sig Dispense Refill Last Dose  . apixaban (ELIQUIS) 5 MG TABS tablet Take 2 tablets (10 mg total) by mouth 2 (two) times daily. Take 2 tabs PO twice a day for 1 week and then 1 tab PO twice a day after that (Patient taking  differently: Take 5 mg by mouth 2 (two) times daily. ) 60 tablet 0 unknown at unknown  . aspirin EC 81 MG tablet Take 1 tablet (81 mg total) by mouth daily. (Patient not taking: Reported on 12/18/2016) 90 tablet 3 Not Taking at Unknown time  . atorvastatin (LIPITOR) 40 MG tablet Take 1 tablet (40 mg total) by mouth daily at 6 PM. (Patient not taking: Reported on 12/18/2016) 30 tablet 2 Not Taking at unknown  . clopidogrel (PLAVIX) 75 MG tablet Take 1 tablet (75 mg total) by mouth daily. (Patient not taking: Reported on 12/18/2016) 90 tablet 3 Not Taking at Unknown time  . gabapentin (NEURONTIN) 300 MG capsule Take 300 mg 3 (three) times daily by mouth.   unknown at unknown  . metoprolol tartrate (LOPRESSOR) 25 MG tablet Take 0.5 tablets (12.5 mg total) by mouth 2 (two) times daily. 30 tablet 2 unknown at unknown  . propranolol (INDERAL) 40 MG tablet Take 40 mg daily by mouth.   unknown at unknown  . risperidone (RISPERDAL) 4 MG tablet Take 4 mg 2 (two) times daily by mouth.   unknown at unknown  . traZODone (DESYREL) 100 MG tablet Take 1  tablet (100 mg total) by mouth at bedtime. 30 tablet 2 unknown at unknown    Patient Stressors: Medication change or noncompliance Other: Life  Patient Strengths: Ability for insight Capable of independent living Communication skills Physical Health Supportive family/friends  Treatment Modalities: Medication Management, Group therapy, Case management,  1 to 1 session with clinician, Psychoeducation, Recreational therapy.   Physician Treatment Plan for Primary Diagnosis: <principal problem not specified> Long Term Goal(s):     Short Term Goals:    Medication Management: Evaluate patient's response, side effects, and tolerance of medication regimen.  Therapeutic Interventions: 1 to 1 sessions, Unit Group sessions and Medication administration.  Evaluation of Outcomes: Not Met  Physician Treatment Plan for Secondary Diagnosis: Active Problems:    Schizoaffective disorder, depressive type ([place])  Long Term Goal(s):     Short Term Goals:       Medication Management: Evaluate patient's response, side effects, and tolerance of medication regimen.  Therapeutic Interventions: 1 to 1 sessions, Unit Group sessions and Medication administration.  Evaluation of Outcomes: Not Met   RN Treatment Plan for Primary Diagnosis: <principal problem not specified> Long Term Goal(s): Knowledge of disease and therapeutic regimen to maintain health will improve  Short Term Goals: Ability to verbalize feelings will improve, Ability to identify and develop effective coping behaviors will improve and Compliance with prescribed medications will improve  Medication Management: RN will administer medications as ordered by provider, will assess and evaluate patient's response and provide education to patient for prescribed medication. RN will report any adverse and/or side effects to prescribing provider.  Therapeutic Interventions: 1 on 1 counseling sessions, Psychoeducation, Medication administration, Evaluate responses to treatment, Monitor vital signs and CBGs as ordered, Perform/monitor CIWA, COWS, AIMS and Fall Risk screenings as ordered, Perform wound care treatments as ordered.  Evaluation of Outcomes: Not Met   LCSW Treatment Plan for Primary Diagnosis: <principal problem not specified> Long Term Goal(s): Safe transition to appropriate next level of care at discharge, Engage patient in therapeutic group addressing interpersonal concerns.  Short Term Goals: Engage patient in aftercare planning with referrals and resources, Increase social support and Increase skills for wellness and recovery  Therapeutic Interventions: Assess for all discharge needs, 1 to 1 time with Social worker, Explore available resources and support systems, Assess for adequacy in community support network, Educate family and significant other(s) on suicide prevention, Complete  Psychosocial Assessment, Interpersonal group therapy.  Evaluation of Outcomes: Not Met   Progress in Treatment: Attending groups: No. Participating in groups: No. Taking medication as prescribed: Yes. Toleration medication: Yes. Family/Significant other contact made: No, will contact:  when given permission Patient understands diagnosis: Yes. Discussing patient identified problems/goals with staff: Yes. Medical problems stabilized or resolved: Yes. Denies suicidal/homicidal ideation: Yes. Issues/concerns per patient self-inventory: No. Other: none  New problem(s) identified: No, Describe:  none  New Short Term/Long Term Goal(s):Pt goal: "get my anxiety under control."  Discharge Plan or Barriers: CSW assessing for appropriate plan.  Reason for Continuation of Hospitalization: Depression Medication stabilization  Estimated Length of Stay: 3-5 days.  Recreational Therapy: Patient Stressors: Hearing voices Patient Goal: Patient will identify 3 positive coping skills to better manage anxiety x5 days.  Attendees: Patient:James Abbott 12/20/2016   Physician: Dr. Wonda Olds, MD 12/20/2016   Nursing:  12/20/2016   RN Care Manager: 12/20/2016   Social Worker: Lurline Idol, LCSW 12/20/2016   Recreational Therapist: Roanna Epley, LRT/CTRS 12/20/2016   Other:  12/20/2016   Other:  12/20/2016   Other: 12/20/2016  Scribe for Treatment Team: Deirdre Evener 12/20/2016 12:35 PM

## 2016-12-20 NOTE — Progress Notes (Signed)
Patient up ad lib with steady gait. Alert and oriented to self and place. Complains of bilateral knee pain. Compliant with medication and meals. Denies SI but endorses restless leg syndrome. Endorses AH but states that he can not make out what they are saying. Will continue to monitor.

## 2016-12-20 NOTE — [facility] Suicide Risk Assessment (Signed)
[facility] INPATIENT:  Family/Significant Other Suicide Prevention Education  Suicide Prevention Education:  Patient Refusal for Family/Significant Other Suicide Prevention Education: The patient James Abbott has refused to provide written consent for family/significant other to be provided Family/Significant Other Suicide Prevention Education during admission and/or prior to discharge.  Physician notified.  Sallee Langenne C Cunningham 12/20/2016, 11:43 AM

## 2016-12-21 MED ORDER — OLANZAPINE 10 MG PO TABS
10.0000 mg | ORAL_TABLET | Freq: Every day | ORAL | Status: DC
  Administered 2016-12-21: 10 mg via ORAL
  Filled 2016-12-21: qty 1

## 2016-12-21 MED ORDER — OLANZAPINE 5 MG PO TABS
5.0000 mg | ORAL_TABLET | Freq: Every day | ORAL | Status: DC
  Administered 2016-12-22 – 2016-12-25 (×4): 5 mg via ORAL
  Filled 2016-12-21 (×5): qty 1

## 2016-12-21 MED ORDER — DOCUSATE SODIUM 100 MG PO CAPS
100.0000 mg | ORAL_CAPSULE | Freq: Two times a day (BID) | ORAL | Status: DC | PRN
  Administered 2016-12-21 – 2016-12-25 (×3): 100 mg via ORAL
  Filled 2016-12-21 (×3): qty 1

## 2016-12-21 NOTE — Progress Notes (Signed)
James Abbott was cooperative with treatment, he had no complaints on shift, he was medication compliant, he interacted well with peers and staff on shift. No behavioral issues to report on shift at this time.

## 2016-12-21 NOTE — Plan of Care (Signed)
Patient attends groups and is compliant with medications, and eats meals adequately. Patient denies SI, HI and hallucinations at present time. Patient complained of constipation. MD ordered colace and patient was educated by RN on colace and the importance of drinking water and less caffeine drinks. Patient is appropriate with peers and staff.

## 2016-12-21 NOTE — Progress Notes (Addendum)
[facility]  12/21/2016 12:42 PM James Abbott  MRN:  [id number]    Subjective:    The patient reports that he slept fairly well last night, over 6 hours but still had some nightmares and flashbacks related to his father's death. He does report ongoing auditory hallucinations telling him to kill himself and others but says they have decreased in intensity and frequency since yesterday. He says he does not want to kill himself and does not feel suicidal but voices tell him to kill himself. He minimizes marijuana and opioid use and is not interested in any substance abuse treatment at this time. The patient has been visible on the unit, sitting the day room and interacting with peers appropriately. He says his mother and sister cannot visit him because his mother cannot drive. He denies any physical adverse side effects associated with medication other than some restless leg vital signs are stable.   Past Psychiatric History: Pt reports history of schizophrenia. Per chart review, history of PTSD, MDD with psychotic features and substance abuse. He states that he has an upcoming appointment with a psychiatrist and therapist but is not sure where it is. He reports a suicide attempt "many years ago and it wasn't really  Suicide attempt." where he overdosed on pills. He reports multiple inpatient hospitalizations in the past. [facility]. He reports multiple medication trials in the past including Haldol, Geodon, Prolixin, Clozapine.   Social History: [place], [place]. He lives with his mother and 45 yo daughter, August. He is not married or in a relationships. 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 He is trying to get on disability. He states that he does not have a support system at all.   Family Psychiatric  History: Unknown    Principal Problem: Schizoaffective disorder, depressive type (HCC) Diagnosis:   Patient Active Problem List   Diagnosis Date Noted  . Akathisia  [G25.71] 12/19/2016  . Suicidal ideation [R45.851] 12/19/2016  . Psychosis (HCC) [F29] 12/19/2016  . Schizoaffective disorder, depressive type (HCC) [F25.1] 12/19/2016  . PTSD (post-traumatic stress disorder) [F43.10] 03/25/2016  . Marijuana abuse [F12.10] 03/25/2016  . Noncompliance [Z91.19] 03/25/2016  . History of cocaine abuse [Z87.898] 03/25/2016  . Non-ST elevation (NSTEMI) myocardial infarction (HCC) [I21.4]   . Sepsis (HCC) [A41.9] 03/23/2016   Total Time spent with patient: 30 minutes    Past Medical History:  Past Medical History:  Diagnosis Date  . Hypertension   . MI (myocardial infarction) [facility](HCC)     Past Surgical History:  Procedure Laterality Date  . CARDIAC CATHETERIZATION     Family History:  Family History  Problem Relation Age of Onset  . Heart attack Maternal Uncle   . Heart attack Paternal Uncle     Social History:  Social History   Substance and Sexual Activity  Alcohol Use No   Comment: Stated he stopped 9 yrs ago     Social History   Substance and Sexual Activity  Drug Use No    Social History   Socioeconomic History  . Marital status: Single    Spouse name: None  . Number of children: None  . Years of education: None  . Highest education level: None  Social Needs  . Financial resource strain: None  . Food insecurity - worry: None  . Food insecurity - inability: None  . Transportation needs - medical: None  . Transportation needs - non-medical: None  Occupational History  . None  Tobacco Use  . Smoking status: Current Every Day Smoker    Packs/day: 0.25    Years: 30.00    Pack years: 7.50    Types: Cigarettes  . Smokeless tobacco: Never Used  . Tobacco comment: quit 5 weeks ago, presently wearing a patch  Substance and Sexual Activity  . Alcohol use: No    Comment: Stated he stopped 9 yrs ago  . Drug use: No  . Sexual activity: No    Birth control/protection: None  Other Topics Concern  . None  Social History Narrative   . None    Sleep: Good  Appetite:  Good  Current Medications: Current Facility-Administered Medications  Medication Dose Route Frequency Provider Last Rate Last Dose  . acetaminophen (TYLENOL) tablet 650 mg  650 mg Oral Q6H PRN Clapacs, [name], MD   650 mg at 12/21/16 0807  . alum & mag hydroxide-simeth (MAALOX/MYLANTA) 200-200-20 MG/5ML suspension 30 mL  30 mL Oral Q4H PRN Clapacs, [name], MD   30 mL at 12/20/16 1847  . benztropine (COGENTIN) tablet 1 mg  1 mg Oral BID Clapacs, [name], MD   1 mg at 12/21/16 0807  . docusate sodium (COLACE) capsule 100 mg  100 mg Oral BID PRN [name], MD   100 mg at 12/21/16 1610  . gabapentin (NEURONTIN) tablet 300 mg  300 mg Oral TID [name], MD   300 mg at 12/21/16 0807  . hydrOXYzine (ATARAX/VISTARIL) tablet 50 mg  50 mg Oral TID PRN Clapacs, [name], MD   50 mg at 12/19/16 1745  . LORazepam (ATIVAN) tablet 1 mg  1 mg Oral Q6H PRN [name], MD   1 mg at 12/21/16 0807  . magnesium hydroxide (MILK OF MAGNESIA) suspension 30 mL  30 mL Oral Daily PRN Clapacs, [name], MD      . nicotine (NICODERM CQ - dosed in mg/24 hours) patch 21 mg  21 mg Transdermal Daily [name], [name], MD   21 mg at 12/21/16 0811  . OLANZapine (ZYPREXA) tablet 10 mg  10 mg Oral QHS [name], MD      . Melene Muller ON 12/22/2016] OLANZapine (ZYPREXA) tablet 5 mg  5 mg Oral Daily [name], MD      . propranolol (INDERAL) tablet 20 mg  20 mg Oral TID [name], MD   20 mg at 12/21/16 0807  . traZODone (DESYREL) tablet 150 mg  150 mg Oral QHS Clapacs, [name], MD   150 mg at 12/20/16 2126    Lab Results:  Results for orders placed or performed during the hospital encounter of 12/19/16 (from the past 48 hour(s))  Hemoglobin A1c     Status: Abnormal   Collection Time: 12/20/16  7:41 AM  Result Value Ref Range   Hgb A1c MFr Bld 5.7 (H) 4.8 - 5.6 %    Comment: (NOTE) Pre diabetes:          5.7%-6.4% Diabetes:              >6.4% Glycemic control for    <7.0% adults with diabetes    Mean Plasma Glucose 116.89 mg/dL    Comment: Performed at [facility], [address] [place], [place] [address]  Lipid panel     Status: Abnormal   Collection Time: 12/20/16  7:41 AM  Result Value Ref Range   Cholesterol 290 (H) 0 - 200 mg/dL   Triglycerides 409 (H) <150  mg/dL   HDL 42 >16>40 mg/dL   Total CHOL/HDL Ratio 6.9 RATIO   VLDL 33 0 - 40 mg/dL   LDL Cholesterol 109215 (H) 0 - 99 mg/dL    Comment:        Total Cholesterol/HDL:CHD Risk Coronary Heart Disease Risk Table                     Men   Women  1/2 Average Risk   3.4   3.3  Average Risk       5.0   4.4  2 X Average Risk   9.6   7.1  3 X Average Risk  23.4   11.0        Use the calculated Patient Ratio above and the CHD Risk Table to determine the patient's CHD Risk.        ATP III CLASSIFICATION (LDL):  <100     mg/dL   Optimal  [id number]  mg/dL   Near or Above                    Optimal  130-159  mg/dL   Borderline  [id number]  mg/dL   High  >478>190     mg/dL   Very High   TSH     Status: None   Collection Time: 12/20/16  7:41 AM  Result Value Ref Range   TSH 1.294 0.350 - 4.500 uIU/mL    Comment: Performed by a 3rd Generation assay with a functional sensitivity of <=0.01 uIU/mL.    Blood Alcohol level:  No results found for: [facility]  Metabolic Disorder Labs: Lab Results  Component Value Date   HGBA1C 5.7 (H) 12/20/2016   MPG 116.89 12/20/2016   MPG 108 03/24/2016   No results found for: PROLACTIN Lab Results  Component Value Date   CHOL 290 (H) 12/20/2016   TRIG 165 (H) 12/20/2016   HDL 42 12/20/2016   CHOLHDL 6.9 12/20/2016   VLDL 33 12/20/2016   LDLCALC 215 (H) 12/20/2016   LDLCALC 113 (H) 03/24/2016     Musculoskeletal: Strength & Muscle Tone: within normal limits Gait & Station: normal Patient leans: N/A  Psychiatric Specialty Exam: Physical Exam  Review of Systems  Constitutional: Negative.   HENT: Negative.   Eyes: Negative.   Respiratory:  Negative.   Cardiovascular: Negative.   Gastrointestinal: Negative.   Genitourinary: Negative.   Musculoskeletal: Negative.   Skin: Negative.   Neurological: Negative.   Endo/Heme/Allergies: Negative.     Blood pressure 128/78, pulse 81, temperature 98.4 F (36.9 C), temperature source Oral, resp. rate 18, height 5\' 8"  (1.727 m), weight 73 kg (161 lb), SpO2 96 %.Body mass index is 24.48 kg/m.  General Appearance: Casual  Eye Contact:  Good  Speech:  Clear and Coherent and Normal Rate  Volume:  Normal  Mood:  Depressed  Affect:  Depressed  Thought Process:  Coherent, Goal Directed and Linear  Orientation:  Full (Time, Place, and Person)  Thought Content:  Illogical  Suicidal Thoughts:  Yes.  without intent/plan  Homicidal Thoughts:  Yes.  without intent/plan  Memory:  Immediate;   Fair Recent;   Fair Remote;   Fair  Judgement:  Impaired  Insight:  Lacking  Psychomotor Activity:  Normal  Concentration:  Concentration: Fair and Attention Span: Fair  Recall:  Fis[employer]Fair  Fund of Knowledge:  Fair  Language:  Good  Akathisia:  No  Handed:  Right  AIMS (if indicated):  Assets:  Housing Physical Health  ADL's:  Intact  Cognition:  WNL  Sleep:  Number of Hours: 6.5     Treatment Plan Summary:  Treatment Plan Summary: 46 yo male with history of schizophrenia, MDD with psychosis, substance induced psychosis, and PTSD and history of medication non compliance admitted due to anxiety and AH and SI. Pt was recently inpatient at [facility]. He has history of substance abuse and past diagnoses included substance induced mood disorder. Per chart, pt has endorsed a chronic hallucination of "[name]" which developed after significant childhood trauma. Pt states that this voice is always there and has never gone away completely. He has been on many different anti-psychotics in the past. He was most recently discharged from [facility] on high doses of Risperdal and has developed significant akathisia. He is  unable to sit still during interview and is very restless. He states that Geodon has been helpful in the past but he is unable to afford this due to insurance issues. He has not tried Zyprexa and will to try this for AH. He states that he does not have a desire to kill himself but he is having AH telling him to do so. This AH worsened after he had a  Panic attack. He denies recent drug use although his UDS is positive for opiates.    Psychosis -Risperdal was discontinued in ED. He was started on Geodon however, pt is not able to afford this without insurance. Will increase Zyprexa 5mg  po daily and 10mg  po nightly for psychosis and will titrate up as tolerated. Total cholesterol 290 and HgA1c and 5.2 -Restart Propanol 20 mg TID for akathisia -He was started on Cogentin 1 mg BID as he felt this was beneficial for restlessness -PRN Ativan as pt is extremely anxious and restless. He is aware that he will not continue this as an outpatient  Insomnia -Trazodone 150 mg qhs  Anxiety -Gabapentin 300 mg TID  Cannabis Use Disorder, Opoid Use Disorder: - He was advised to abstain from all illicit drugs as they may worsen mood symptoms and psychosis - He is not interested in any substance abuse treatment  Disposition -The patient lives with his mother and daughter - He will followup with [facility]    Daily contact with patient to assess and evaluate symptoms and progress in treatment and Medication management  Levora Angel, MD 12/21/2016, 12:42 PM

## 2016-12-21 NOTE — [facility] Group Notes (Signed)
12/21/2016 1:15pm  Type of Therapy/Topic:  Group Therapy:  Balance in Life  Participation Level:  Active  Description of Group:   This group will address the concept of balance and how it feels and looks when one is unbalanced. Patients will be encouraged to process areas in their lives that are out of balance and identify reasons for remaining unbalanced. Facilitators will guide patients in utilizing problem-solving interventions to address and correct the stressor making their life unbalanced. Understanding and applying boundaries will be explored and addressed for obtaining and maintaining a balanced life. Patients will be encouraged to explore ways to assertively make their unbalanced needs known to significant others in their lives, using other group members and facilitator for support and feedback.  Therapeutic Goals: 1. Patient will identify two or more emotions or situations they have that consume much of in their lives. 2. Patient will identify signs/triggers that life has become out of balance:  3. Patient will identify two ways to set boundaries in order to achieve balance in their lives:  4. Patient will demonstrate ability to communicate their needs through discussion and/or role plays  Summary of Patient Progress: Patient identified his triggers as lack of sleep, eat, and not taking his medications. He reported that he is feeling better now that he is back on his medications. Patient fully participating in group discussion.       Therapeutic Modalities:   Cognitive Behavioral Therapy Solution-Focused Therapy Assertiveness Training  Makendra Vigeant  CUEBAS-COLON, LCSW 12/21/2016 1:03 PM

## 2016-12-21 NOTE — Plan of Care (Signed)
  Progressing Education: Knowledge of [facility] Education information/materials will improve 12/21/2016 2319 - Progressing by Addison Naegelieynolds, Traver Meckes I, RN 12/21/2016 2018 - Progressing by Addison Naegelieynolds, Suzana Sohail I, RN Emotional status will improve 12/21/2016 2319 - Progressing by Addison Naegelieynolds, Nithya Meriweather I, RN 12/21/2016 2018 - Progressing by Addison Naegelieynolds, Zanna Hawn I, RN Note Patient verbalizes improvement in anxiety and depression.  Coping: Ability to verbalize frustrations and anger appropriately will improve 12/21/2016 2319 - Progressing by Berkley Harveyeynolds, Aveline Daus I, RN 12/21/2016 2018 - Progressing by Berkley Harveyeynolds, Tlaloc Taddei I, RN Note Patient verbalizes frustration with restless legs.  Ability to demonstrate self-control will improve 12/21/2016 2018 - Progressing by Berkley Harveyeynolds, Ree Alcalde I, RN Health Behavior/Discharge Planning: Identification of resources available to assist in meeting health care needs will improve 12/21/2016 2018 - Progressing by Addison Naegelieynolds, Giavanni Odonovan I, RN Compliance with treatment plan for underlying cause of condition will improve 12/21/2016 2018 - Progressing by Addison Naegelieynolds, Candia Kingsbury I, RN Safety: Periods of time without injury will increase 12/21/2016 2018 - Progressing by Addison Naegelieynolds, Shiryl Ruddy I, RN Medication: Ability to use medications correctly will improve 12/21/2016 2319 - Progressing by Berkley Harveyeynolds, Vernal Hritz I, RN 12/21/2016 2018 - Progressing by Berkley Harveyeynolds, Eli Pattillo I, RN [facility] Participation in Recreation Therapeutic Interventions STG-Other Recreation Therapy Goal (Specify) Description Patient will identify 3 positive coping skills to better manage anxiety x5 days.  12/21/2016 2018 - Progressing by Berkley Harveyeynolds, Zamiya Dillard I, RN

## 2016-12-21 NOTE — [facility] Group Notes (Signed)
[facility] Group Notes:  (Nursing/MHT/Case Management/Adjunct)  Date:  12/21/2016  Time:  9:34 PM  Type of Therapy:  Group Therapy  Participation Level:  Active  Participation Quality:  Appropriate  Affect:  Appropriate  Cognitive:  Alert  Insight:  Good  Engagement in Group:  Engaged  Modes of Intervention:  Support  Summary of Progress/Problems:  Mayra NeerJackie L Toneka Fullen 12/21/2016, 9:34 PM

## 2016-12-21 NOTE — Progress Notes (Signed)
D: Pt denies SI/HI/AVH. Pt is pleasant and cooperative. Patient complains of restless legs. Patient is seen in day room interacting with peers. Patient states that he is now " feeling better." Patient also states that when he has his "psych-meds onboard" he "does not hear the voices." Patient states a decrease in anxiety and depression.    A: Q x 15 minute observation checks were completed for safety. Patient was provided with education on medications. Patient was offered support and encouragement. Patient was given scheduled medications, and PRN medications. Patient  was encourage to attend groups, participate in unit activities and continue with plan of care.   R: Patient is complaint with medication. Patient has no complaints at this time. Patient  receptive to treatment and safety maintained on unit.

## 2016-12-21 NOTE — [facility] Group Notes (Signed)
[facility] Group Notes:  (Nursing/MHT/Case Management/Adjunct)  Date:  12/21/2016  Time:  6:03 AM  Type of Therapy:  Psychoeducational Skills  Participation Level:  Active  Participation Quality:  Appropriate, Attentive and Sharing  Affect:  Appropriate  Cognitive:  Appropriate  Insight:  Appropriate and Good  Engagement in Group:  Engaged  Modes of Intervention:  Discussion, Socialization and Support  Summary of Progress/Problems:  James MilroyLaquanda Y Alyn Abbott 12/21/2016, 6:03 AM

## 2016-12-22 MED ORDER — OLANZAPINE 5 MG PO TABS
15.0000 mg | ORAL_TABLET | Freq: Every day | ORAL | Status: DC
  Administered 2016-12-22 – 2016-12-24 (×3): 15 mg via ORAL
  Filled 2016-12-22 (×3): qty 1

## 2016-12-22 MED ORDER — FAMOTIDINE 20 MG PO TABS
20.0000 mg | ORAL_TABLET | Freq: Every day | ORAL | Status: DC
  Administered 2016-12-22 – 2016-12-27 (×6): 20 mg via ORAL
  Filled 2016-12-22 (×6): qty 1

## 2016-12-22 MED ORDER — TRAZODONE HCL 100 MG PO TABS
100.0000 mg | ORAL_TABLET | Freq: Every day | ORAL | Status: DC
  Administered 2016-12-22: 100 mg via ORAL
  Filled 2016-12-22 (×2): qty 1

## 2016-12-22 NOTE — Progress Notes (Signed)
[facility]  12/22/2016 5:31 PM James SohoDavid William Abbott  MRN:  [id number]    Subjective:     The patient reports that he is better today and akathisia has decreased. The patient says he no longer feels the spontaneous need to jump up out of his chair. He is his auditory hallucinations have decreased but are still present. He says the voices are telling him to kill himself that he does not want to kill himself. He does report that they are command in nature but has not acted on voices. He also is still having some racing thoughts. The patient says he worries excessively about "little things". Patient denies any current active or passive suicidal thoughts. He is well and is requesting Pepcid as he is having some heartburn. He denies any problems with insomnia and slept fairly well last night. Appetite is good. He is coming out for meals and has attended some groups today. He says he about triggers for anxiety and depression.  Past Psychiatric History: Pt reports history of schizophrenia. Per chart review, history of PTSD, MDD with psychotic features and substance abuse. He states that he has an upcoming appointment with a psychiatrist and therapist but is not sure where it is. He reports a suicide attempt "many years ago and it wasn't really  Suicide attempt." where he overdosed on pills. He reports multiple inpatient hospitalizations in the past. [facility]. He reports multiple medication trials in the past including Haldol, Geodon, Prolixin, Clozapine.   Social History: [place], [place]. He lives with his mother and 45 yo daughter, August. He is not married or in a relationships. 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 He is trying to get on disability. He states that he does not have a support system at all.   Family Psychiatric  History: Unknown    Principal Problem: Schizoaffective disorder, depressive type (HCC) Diagnosis:   Patient Active Problem List   Diagnosis Date  Noted  . Akathisia [G25.71] 12/19/2016  . Suicidal ideation [R45.851] 12/19/2016  . Psychosis (HCC) [F29] 12/19/2016  . Schizoaffective disorder, depressive type (HCC) [F25.1] 12/19/2016  . PTSD (post-traumatic stress disorder) [F43.10] 03/25/2016  . Marijuana abuse [F12.10] 03/25/2016  . Noncompliance [Z91.19] 03/25/2016  . History of cocaine abuse [Z87.898] 03/25/2016  . Non-ST elevation (NSTEMI) myocardial infarction (HCC) [I21.4]   . Sepsis (HCC) [A41.9] 03/23/2016   Total Time spent with patient: 20 minutes    Past Medical History:  Past Medical History:  Diagnosis Date  . Hypertension   . MI (myocardial infarction) [facility](HCC)     Past Surgical History:  Procedure Laterality Date  . CARDIAC CATHETERIZATION     Family History:  Family History  Problem Relation Age of Onset  . Heart attack Maternal Uncle   . Heart attack Paternal Uncle     Social History:  Social History   Substance and Sexual Activity  Alcohol Use No   Comment: Stated he stopped 9 yrs ago     Social History   Substance and Sexual Activity  Drug Use No    Social History   Socioeconomic History  . Marital status: Single    Spouse name: None  . Number of children: None  . Years of education: None  . Highest education level: None  Social Needs  . Financial resource strain: None  . Food insecurity - worry: None  . Food insecurity - inability: None  . Transportation needs - medical: None  . Transportation needs -  non-medical: None  Occupational History  . None  Tobacco Use  . Smoking status: Current Every Day Smoker    Packs/day: 0.25    Years: 30.00    Pack years: 7.50    Types: Cigarettes  . Smokeless tobacco: Never Used  . Tobacco comment: quit 5 weeks ago, presently wearing a patch  Substance and Sexual Activity  . Alcohol use: No    Comment: Stated he stopped 9 yrs ago  . Drug use: No  . Sexual activity: No    Birth control/protection: None  Other Topics Concern  . None   Social History Narrative  . None    Sleep: Good  Appetite:  Good  Current Medications: Current Facility-Administered Medications  Medication Dose Route Frequency Provider Last Rate Last Dose  . acetaminophen (TYLENOL) tablet 650 mg  650 mg Oral Q6H PRN Clapacs, [name], MD   650 mg at 12/22/16 1416  . alum & mag hydroxide-simeth (MAALOX/MYLANTA) 200-200-20 MG/5ML suspension 30 mL  30 mL Oral Q4H PRN Clapacs, [name], MD   30 mL at 12/21/16 1446  . benztropine (COGENTIN) tablet 1 mg  1 mg Oral BID Clapacs, [name], MD   1 mg at 12/22/16 0818  . docusate sodium (COLACE) capsule 100 mg  100 mg Oral BID PRN [name], MD   100 mg at 12/21/16 1610  . famotidine (PEPCID) tablet 20 mg  20 mg Oral Daily [name], MD      . gabapentin (NEURONTIN) tablet 300 mg  300 mg Oral TID [name], MD   300 mg at 12/22/16 1224  . hydrOXYzine (ATARAX/VISTARIL) tablet 50 mg  50 mg Oral TID PRN Clapacs, [name], MD   50 mg at 12/19/16 1745  . LORazepam (ATIVAN) tablet 1 mg  1 mg Oral Q6H PRN [name], MD   1 mg at 12/22/16 1416  . magnesium hydroxide (MILK OF MAGNESIA) suspension 30 mL  30 mL Oral Daily PRN Clapacs, [name], MD      . nicotine (NICODERM CQ - dosed in mg/24 hours) patch 21 mg  21 mg Transdermal Daily [name], [name], MD   21 mg at 12/22/16 0830  . OLANZapine (ZYPREXA) tablet 15 mg  15 mg Oral QHS [name], MD      . OLANZapine [facility]) tablet 5 mg  5 mg Oral Daily [name], MD   5 mg at 12/22/16 0818  . propranolol (INDERAL) tablet 20 mg  20 mg Oral TID [name], MD   20 mg at 12/22/16 1224  . traZODone (DESYREL) tablet 100 mg  100 mg Oral QHS [name], MD        Lab Results:  No results found for this or any previous visit (from the past 48 hour(s)).  Blood Alcohol level:  No results found for: [facility]  Metabolic Disorder Labs: Lab Results  Component Value Date   HGBA1C 5.7 (H) 12/20/2016   MPG 116.89 12/20/2016   MPG 108 03/24/2016   No  results found for: PROLACTIN Lab Results  Component Value Date   CHOL 290 (H) 12/20/2016   TRIG 165 (H) 12/20/2016   HDL 42 12/20/2016   CHOLHDL 6.9 12/20/2016   VLDL 33 12/20/2016   LDLCALC 215 (H) 12/20/2016   LDLCALC 113 (H) 03/24/2016     Musculoskeletal: Strength & Muscle Tone: within normal limits Gait & Station: normal Patient leans: N/A  Psychiatric Specialty Exam: Physical Exam  Review of Systems  Constitutional: Negative.   HENT: Negative.   Eyes: Negative.   Respiratory: Negative.   Cardiovascular: Negative.   Gastrointestinal: Negative.   Genitourinary: Negative.   Musculoskeletal: Negative.   Skin: Negative.   Neurological: Negative.   Endo/Heme/Allergies: Negative.     Blood pressure 120/80, pulse 82, temperature 97.8 F (36.6 C), resp. rate 18, height 5\' 8"  (1.727 m), weight 73 kg (161 lb), SpO2 96 %.Body mass index is 24.48 kg/m.  General Appearance: Casual  Eye Contact:  Good  Speech:  Clear and Coherent and Normal Rate  Volume:  Normal  Mood:  "I feel better today"  Affect:  Restricted  Thought Process:  Coherent, Goal Directed and Linear  Orientation:  Full (Time, Place, and Person)  Thought Content:  Illogical  Suicidal Thoughts:  Yes.  without intent/plan  Homicidal Thoughts:  Yes.  without intent/plan  Memory:  Immediate;   Fair Recent;   Fair Remote;   Fair  Judgement:  Impaired  Insight:  Lacking  Psychomotor Activity:  Normal  Concentration:  Concentration: Fair and Attention Span: Fair  Recall:  [employer]Fair  Fund of Knowledge:  Fair  Language:  Good  Akathisia:  No  Handed:  Right  AIMS (if indicated):     Assets:  Housing Physical Health  ADL's:  Intact  Cognition:  WNL  Sleep:  Number of Hours: 6.5     Treatment Plan Summary:  Treatment Plan Summary: 46 yo male with history of schizophrenia, MDD with psychosis, substance induced psychosis, and PTSD and history of medication non compliance admitted due to anxiety and AH and  SI. [facility]. He has history of substance abuse and past diagnoses included substance induced mood disorder. Per chart, pt has endorsed a chronic hallucination of "[name]" which developed after significant childhood trauma. Pt states that this voice is always there and has never gone away completely. He has been on many different anti-psychotics in the past. [facility] on high doses of Risperdal and has developed significant akathisia. He is unable to sit still during interview and is very restless. He states that Geodon has been helpful in the past but he is unable to afford this due to insurance issues. He has not tried Zyprexa and will to try this for AH. He states that he does not have a desire to kill himself but he is having AH telling him to do so. This AH worsened after he had a  Panic attack. He denies recent drug use although his UDS is positive for opiates.    Psychosis -Risperdal was discontinued in ED. He was started on Geodon however, pt is not able to afford this without insurance. Will increase Zyprexa 5mg  po daily and 15mg  po nightly for psychosis and will titrate up as tolerated. Total cholesterol 290 and HgA1c and 5.2 -Continue Propanol 20 mg TID for akathisia -He was started on Cogentin 1 mg BID as he felt this was beneficial for restlessness -PRN Ativan as pt is extremely anxious and restless. He is aware that he will not continue this as an outpatient  Insomnia -Due to Zyprexa being increased, we'll decrease trazodone to a total of 100 mg by mouth nightly   Anxiety depressive episode o5 no z -Continue Gabapentin 300 mg TID  Cannabis Use Disorder, Opoid Use Disorder: - He was advised to abstain from all illicit drugs as they may worsen mood symptoms and psychosis - He is not  interested in any substance abuse treatment  Disposition -The patient lives with his mother and daughter - He will followup with [facility]    Daily  contact with patient to assess and evaluate symptoms and progress in treatment and Medication management  Levora Angel, MD 12/22/2016, 5:31 PM

## 2016-12-22 NOTE — Plan of Care (Signed)
  Progressing Education: Knowledge of [facility] Education information/materials will improve 12/22/2016 2203 - Progressing by Addison Naegelieynolds, Mali Eppard I, RN Emotional status will improve 12/22/2016 2203 - Progressing by Addison Naegelieynolds, Aurore Redinger I, RN Coping: Ability to verbalize frustrations and anger appropriately will improve 12/22/2016 2203 - Progressing by Addison Naegelieynolds, Korina Tretter I, RN Safety: Ability to remain free from injury will improve 12/22/2016 2203 - Progressing by Berkley Harveyeynolds, Leeann Bady I, RN

## 2016-12-22 NOTE — Progress Notes (Signed)
Received James Abbott this am after his breakfast,he was compliant with his medications.he rated his depression and anxiety a 7/10, denied feeling SI and hears voices at intervals. He is OOB at intervals ambulating in the hallway. He took a nap this PM after dinner. He continues to C/O foot discomfort and medicated with tylenol with positive effects.

## 2016-12-22 NOTE — [facility] Group Notes (Signed)
12/22/2016 1:15am  Type of Therapy and Topic:  Group Therapy:  Cognitive Distortions  Participation Level:  Minimal   Description of Group:    Patients in this group will be introduced to the topic of cognitive distortions.  Patients will identify and describe cognitive distortions, describe the feelings these distortions create for them.  Patients will identify one or more situations in their personal life where they have cognitively distorted thinking and will verbalize challenging this cognitive distortion through positive thinking skills.  Patients will practice the skill of using positive affirmations to challenge cognitive distortions using affirmation cards.    Therapeutic Goals:  1. Patient will identify two or more cognitive distortions they have used 2. Patient will identify one or more emotions that stem from use of a cognitive distortion 3. Patient will demonstrate use of a positive affirmation to counter a cognitive distortion through discussion and/or role play. 4. Patient will describe one way cognitive distortions can be detrimental to wellness   Summary of Patient Progress: Patient reports that today is a good day. Patient stayed in group discussion but did not participated.       Therapeutic Modalities:   Cognitive Behavioral Therapy Motivational Interviewing   James Abbott  CUEBAS-COLON, LCSW 12/22/2016 2:43 PM

## 2016-12-23 MED ORDER — PROPRANOLOL HCL 20 MG PO TABS
40.0000 mg | ORAL_TABLET | Freq: Three times a day (TID) | ORAL | Status: DC
  Administered 2016-12-23 – 2016-12-27 (×12): 40 mg via ORAL
  Filled 2016-12-23 (×12): qty 2

## 2016-12-23 MED ORDER — TRAZODONE HCL 50 MG PO TABS
150.0000 mg | ORAL_TABLET | Freq: Every day | ORAL | Status: DC
  Administered 2016-12-23 – 2016-12-26 (×4): 150 mg via ORAL
  Filled 2016-12-23 (×5): qty 1

## 2016-12-23 MED ORDER — GABAPENTIN 600 MG PO TABS
600.0000 mg | ORAL_TABLET | Freq: Three times a day (TID) | ORAL | Status: DC
  Administered 2016-12-23 – 2016-12-26 (×9): 600 mg via ORAL
  Filled 2016-12-23 (×9): qty 1

## 2016-12-23 MED ORDER — LORAZEPAM 0.5 MG PO TABS
0.5000 mg | ORAL_TABLET | Freq: Four times a day (QID) | ORAL | Status: DC | PRN
  Administered 2016-12-23 – 2016-12-24 (×4): 0.5 mg via ORAL
  Filled 2016-12-23 (×4): qty 1

## 2016-12-23 NOTE — Progress Notes (Signed)
Recreation Therapy Notes   Date: 11.12.18  Time: 3:00pm  Location: Craft room  Behavioral response: Disengaged  Group Type: Craft  Participation level: None  Communication: Patient was disengaged from staff and peers during group.  Comments: Patient left group early due to unknown reasons and never returned.  Lexany Belknap LRT/CTRS        Carol Theys 12/23/2016 3:55 PM

## 2016-12-23 NOTE — Progress Notes (Signed)
Recreation Therapy Notes   Date: 11.12.18  Time: 9:30 am   Location: Craft Room  Behavioral response: Appropriate, Confused  Intervention Topic: Creative Expressions  Discussion/Intervention: Group content on today was focused on creative expressions. The group defined creative expressions and ways it can be used. Patients identified ways to express themselves and what they normally use to express themselves. Individuals described if they normally use creative expressions and ways they can positively express themselves in the future. The group identified why it is important to express themselves. Individual participated in the intervention "Origami Butterfly" where they had a chance to make a butterfly using the origami method and paper.  Clinical Observations/Feedback:  Patient came to group and expressed that he uses exercise as a way to express himself. Individual stated that he uses creative expressions when he is sad or upset. Patient got confused in the middle of group and stated he was hearing voices and could not continue with group. Patient left group but later returned.   Lanyah Spengler LRT/CTRS           Rael Tilly 12/23/2016 11:25 AM

## 2016-12-23 NOTE — Progress Notes (Signed)
[facility]  12/23/2016 2:02 PM James SohoDavid William Abbott  MRN:  [id number]   Subjective:  Pt states that he is slightly doing better today. He states that he feels much less restless today. He states that he feels the Zyprexa has been helpful for the voices. He states that he thinks the Ativan has helped with the restlessness. We discussed again that Ativan will not be something he will continue as an outpatient due to the addictive nature of this. This is just during hospitalization until Risperdal gets out of his system. Pt states that the voices are much quieter. He states that they are still there and still tell him to kill himself. He is able to tell that they are not real and states, "i don't want to kill myself. It's just the voice telling me to." He denies plan or intent to at on this. He states that there is one main voice, James Abbott, adn multiple other voices that are "chatter." he states that he has heard James Abbott since he was a little kid. This occurred after a traumatic event. He states that the voices has never gone away completely. He was on Geodon in the past and this was the most helpful but he is unable to afford it without insurance. He feels Zyprexa is helpful so far. He is sleeping better while in the hospital. Prior to this, he had been up for 5 days. He is attending all groups and has been getting a lot out of them. He is organized and goal directed in conversation. He is still restless and unable to sit for long periods of time but this is much improved from admission. He states that his daughter is what keeps him going.   Principal Problem: Schizoaffective disorder, depressive type (HCC) Diagnosis:   Patient Active Problem List   Diagnosis Date Noted  . Akathisia [G25.71] 12/19/2016  . Suicidal ideation [R45.851] 12/19/2016  . Psychosis (HCC) [F29] 12/19/2016  . Schizoaffective disorder, depressive type (HCC) [F25.1] 12/19/2016  . PTSD (post-traumatic stress disorder) [F43.10]  03/25/2016  . Marijuana abuse [F12.10] 03/25/2016  . Noncompliance [Z91.19] 03/25/2016  . History of cocaine abuse [Z87.898] 03/25/2016  . Non-ST elevation (NSTEMI) myocardial infarction (HCC) [I21.4]   . Sepsis (HCC) [A41.9] 03/23/2016   Total Time spent with patient: 20 minutes  Past Psychiatric History: See H&P  Past Medical History:  Past Medical History:  Diagnosis Date  . Hypertension   . MI (myocardial infarction) [facility](HCC)     Past Surgical History:  Procedure Laterality Date  . CARDIAC CATHETERIZATION     Family History:  Family History  Problem Relation Age of Onset  . Heart attack Maternal Uncle   . Heart attack Paternal Uncle    Family Psychiatric  History: See H&P Social History:  Social History   Substance and Sexual Activity  Alcohol Use No   Comment: Stated he stopped 9 yrs ago     Social History   Substance and Sexual Activity  Drug Use No    Social History   Socioeconomic History  . Marital status: Single    Spouse name: None  . Number of children: None  . Years of education: None  . Highest education level: None  Social Needs  . Financial resource strain: None  . Food insecurity - worry: None  . Food insecurity - inability: None  . Transportation needs - medical: None  . Transportation needs - non-medical: None  Occupational History  . None  Tobacco Use  .  Smoking status: Current Every Day Smoker    Packs/day: 0.25    Years: 30.00    Pack years: 7.50    Types: Cigarettes  . Smokeless tobacco: Never Used  . Tobacco comment: quit 5 weeks ago, presently wearing a patch  Substance and Sexual Activity  . Alcohol use: No    Comment: Stated he stopped 9 yrs ago  . Drug use: No  . Sexual activity: No    Birth control/protection: None  Other Topics Concern  . None  Social History Narrative  . None    Sleep: Fair  Appetite:  Fair  Current Medications: Current Facility-Administered Medications  Medication Dose Route Frequency  Provider Last Rate Last Dose  . acetaminophen (TYLENOL) tablet 650 mg  650 mg Oral Q6H PRN Clapacs, [name], MD   650 mg at 12/23/16 0815  . alum & mag hydroxide-simeth (MAALOX/MYLANTA) 200-200-20 MG/5ML suspension 30 mL  30 mL Oral Q4H PRN Clapacs, [name], MD   30 mL at 12/21/16 1446  . benztropine (COGENTIN) tablet 1 mg  1 mg Oral BID Clapacs, [name], MD   1 mg at 12/23/16 4098  . docusate sodium (COLACE) capsule 100 mg  100 mg Oral BID PRN [name], MD   100 mg at 12/21/16 1191  . famotidine (PEPCID) tablet 20 mg  20 mg Oral Daily [name], MD   20 mg at 12/23/16 4782  . gabapentin (NEURONTIN) tablet 600 mg  600 mg Oral TID Carlito Bogert, Ileene Hutchinson, MD      . hydrOXYzine (ATARAX/VISTARIL) tablet 50 mg  50 mg Oral TID PRN Clapacs, [name], MD   50 mg at 12/22/16 2143  . LORazepam (ATIVAN) tablet 0.5 mg  0.5 mg Oral Q6H PRN Candi Profit, Ileene Hutchinson, MD      . magnesium hydroxide (MILK OF MAGNESIA) suspension 30 mL  30 mL Oral Daily PRN Clapacs, [name], MD      . nicotine (NICODERM CQ - dosed in mg/24 hours) patch 21 mg  21 mg Transdermal Daily [name], [name], MD   21 mg at 12/23/16 0818  . OLANZapine (ZYPREXA) tablet 15 mg  15 mg Oral QHS [name], MD   15 mg at 12/22/16 2145  . OLANZapine (ZYPREXA) tablet 5 mg  5 mg Oral Daily [name], MD   5 mg at 12/23/16 9562  . propranolol (INDERAL) tablet 40 mg  40 mg Oral TID Keneshia Tena, Ileene Hutchinson, MD      . traZODone (DESYREL) tablet 150 mg  150 mg Oral QHS Dellene Mcgroarty, Ileene Hutchinson, MD        Lab Results: No results found for this or any previous visit (from the past 48 hour(s)).  Blood Alcohol level:  No results found for: [facility]  Metabolic Disorder Labs: Lab Results  Component Value Date   HGBA1C 5.7 (H) 12/20/2016   MPG 116.89 12/20/2016   MPG 108 03/24/2016   No results found for: PROLACTIN Lab Results  Component Value Date   CHOL 290 (H) 12/20/2016   TRIG 165 (H) 12/20/2016   HDL 42 12/20/2016   CHOLHDL 6.9 12/20/2016   VLDL 33 12/20/2016    LDLCALC 215 (H) 12/20/2016   LDLCALC 113 (H) 03/24/2016     Musculoskeletal: Strength & Muscle Tone: within normal limits Gait & Station: normal Patient leans: N/A  Psychiatric Specialty Exam: Physical Exam  ROS  Blood pressure 126/77, pulse 77, temperature 97.7 F (36.5 C), temperature source Oral, resp. rate  18, height 5\' 8"  (1.727 m), weight 73 kg (161 lb), SpO2 96 %.Body mass index is 24.48 kg/m.  General Appearance: Casual  Eye Contact:  Good  Speech:  Clear and Coherent  Volume:  Normal  Mood:  Euthymic  Affect:  Congruent  Thought Process:  Goal Directed  Orientation:  Full (Time, Place, and Person)  Thought Content:  Hallucinations: Auditory  Suicidal Thoughts:  No, Command hallucinations but no intent or desire to hurt himself  Homicidal Thoughts:  No  Memory:  Immediate;   Negative  Judgement:  Fair  Insight:  Fair  Psychomotor Activity:  Normal  Concentration:  Concentration: Fair  Recall:  Fair  Fund of Knowledge:  Fair  Language:  Fair  Akathisia:  Yes      Assets:  Communication Skills Desire for Improvement  ADL's:  Intact  Cognition:  WNL  Sleep:  Number of Hours: 6.5     Treatment Plan Summary: 46 yo male with history of schizoaffective disorder and PTSD admitted due to worsening command hallucinations and akathisia. Pt has chronic AH after a traumatic event in childhood. He states this voice has never gone away and frequently tells him to kill himself. He has no desire or intent to harm himself and is able to ignore the voice. He has history of substance abuse and states that Ativan is the only thing that helps him. He has significant akathisia and on prn Ativan but he is aware this will not be continued as an outpatient. We are titrating propranolol for akathisia. AH are improving on Zyprexa.  Schizoaffective disorder -Continue Zyprexa 5 mg qam and 15 mg qhs  Akathisia -Increase Propranolol to 40 mg TID -Cogentin 1 mg BID -Decrease prn Ativan  to 0.5 mg q6hrs  Insomnia -increase trazodone to 150 mg qhs per pt request  Anxiety -increase Gabapentin to 600 mg TID  Cannabis Use Disorder, Opoid Use Disorder: - He was advised to abstain from all illicit drugs as they may worsen mood symptoms and psychosis - He is not interested in any substance abuse treatment  Disposition -The patient lives with his mother and daughter - He will followup with [facility]    Haskell RilingHolly R Karey Suthers, MD 12/23/2016, 2:02 PM

## 2016-12-23 NOTE — Plan of Care (Signed)
  Progressing Education: Knowledge of [facility] Education information/materials will improve 12/23/2016 1954 - Progressing by Addison Naegelieynolds, Ticia Virgo I, RN Emotional status will improve 12/23/2016 1954 - Progressing by Addison Naegelieynolds, Kali Deadwyler I, RN Coping: Ability to verbalize frustrations and anger appropriately will improve 12/23/2016 1954 - Progressing by Addison Naegelieynolds, Cindra Austad I, RN Safety: Periods of time without injury will increase 12/23/2016 1954 - Progressing by Addison Naegelieynolds, Paytience Bures I, RN Medication: Ability to use medications correctly will improve 12/23/2016 1954 - Progressing by Addison Naegelieynolds, Ainara Eldridge I, RN Safety: Ability to remain free from injury will improve 12/23/2016 1954 - Progressing by Berkley Harveyeynolds, Syreeta Figler I, RN

## 2016-12-23 NOTE — Progress Notes (Signed)
Patient denies SI, HI and AVH. Patient complains of anxiety but states Ativan does help him. Patient also has pain with bilateral knees and states Tylenol does relieve his pain. Patient is medication compliant, and is appropriate to staff and his peers on the unit.

## 2016-12-23 NOTE — Progress Notes (Signed)
D: Pt endorses passive SI and AH, contracts for safety verbally. Pt is pleasant and cooperative. Patient has complaint of restlessness, anxiety, and feet pain. Patient is seen in day room interacting with peers, engaging in conversation. Patient states "I am glad I am here."  A: Q x 15 minute observation checks were completed for safety, patient contract safety for passive SI. Patient was provided with education on medications. Patient was offered support and encouragement. Patient was given scheduled medications. Patient  was encourage to attend groups, participate in unit activities and continue with plan of care.   R: Patient is complaint with medication.  Patient is receptive to treatment and safety maintained on unit.

## 2016-12-23 NOTE — Progress Notes (Signed)
D: Pt denies SI/HI/AVH at this time. Pt is pleasant and cooperative. Patient has complaints of restlessness and anxiety. Patient is up and interacting with peers, patient spend time in dayroom.   A: Q x 15 minute observation checks were completed for safety. Patient was provided with education on medications. Patient was offered support and encouragement. Patient was given scheduled medications and PRN medications. Patient  was encourage to attend groups, participate in unit activities and continue with plan of care.   R: Patient is complaint with medication. Patient is receptive to treatment and safety maintained on unit.

## 2016-12-23 NOTE — Plan of Care (Signed)
Patient attends groups, eats meals adequately, and is appropriate with staff and his peers. At present moment patient denies SI, HI and hallucinations. Patient does complain of pain in leg. Patient mood is anxious and depressed. Patient is med compliant.

## 2016-12-23 NOTE — [facility] Group Notes (Signed)
[facility] LCSW Group Therapy Note  Date/Time: 12/23/16, 1300  Type of Therapy and Topic:  Group Therapy:  Overcoming Obstacles  Participation Level:  active  Description of Group:    In this group patients will be encouraged to explore what they see as obstacles to their own wellness and recovery. They will be guided to discuss their thoughts, feelings, and behaviors related to these obstacles. The group will process together ways to cope with barriers, with attention given to specific choices patients can make. Each patient will be challenged to identify changes they are motivated to make in order to overcome their obstacles. This group will be process-oriented, with patients participating in exploration of their own experiences as well as giving and receiving support and challenge from other group members.  Therapeutic Goals: 1. Patient will identify personal and current obstacles as they relate to admission. 2. Patient will identify barriers that currently interfere with their wellness or overcoming obstacles.  3. Patient will identify feelings, thought process and behaviors related to these barriers. 4. Patient will identify two changes they are willing to make to overcome these obstacles:    Summary of Patient Progress: Pt identified his mental health diagnosis as his main current obstacle.  He shared with the group that he is trying to be placed on disability for his mental health problems, and if successful, he would try to get back into adult high school classes to complete his high school education.  Pt had good discussion with CSW about recognizing a recurrance of mental health symptoms early so that it could be addressed prior to his needing to be admitted to the hospital.        Therapeutic Modalities:   Cognitive Behavioral Therapy Solution Focused Therapy Motivational Interviewing Relapse Prevention Therapy  Daleen SquibbGreg Doylene Splinter, LCSW

## 2016-12-24 MED ORDER — LORAZEPAM 0.5 MG PO TABS
0.2500 mg | ORAL_TABLET | Freq: Four times a day (QID) | ORAL | Status: DC | PRN
  Administered 2016-12-24 – 2016-12-26 (×4): 0.25 mg via ORAL
  Filled 2016-12-24 (×4): qty 1

## 2016-12-24 MED ORDER — TRAZODONE HCL 50 MG PO TABS: 50.0000 mg | ORAL_TABLET | Freq: Every evening | ORAL | Status: DC | PRN

## 2016-12-24 NOTE — Progress Notes (Signed)
Recreation Therapy Notes  Date: 11.13.18  Time: 9:30 am   Location: Craft Room  Behavioral response: N/A  Intervention Topic: Stress  Discussion/Intervention:  Patient did not attend group. Clinical Observations/Feedback:   Patient did not attend group.  Lateshia Schmoker LRT/CTRS           Karmin Kasprzak 12/24/2016 10:29 AM

## 2016-12-24 NOTE — [facility] Group Notes (Signed)
[facility] Group Notes:  (Nursing/MHT/Case Management/Adjunct)  Date:  12/24/2016  Time:  11:00 PM  Type of Therapy:  Evening Wrap-up Group  Participation Level:  Minimal  Participation Quality:  Appropriate  Affect:  Appropriate  Cognitive:  Appropriate  Insight:  Appropriate  Engagement in Group:  Developing/Improving and Engaged  Modes of Intervention:  Activity and Discussion  Summary of Progress/Problems:  Tomasita MorrowChelsea Nanta Teri Diltz 12/24/2016, 11:00 PM

## 2016-12-24 NOTE — Progress Notes (Signed)
[facility]  12/24/2016 3:10 PM James Abbott  MRN:  [id number] Subjective:  Pt states that he is doing slightly better today. He states that the voices are getting better each day. He states that they are still there and "Say bad things and to hurt myself." But they are much quieter today. He states that he always has had voices and they are always negative like they are not but can get much quieter. He states that he does not have a desire to hurt or kill himself and wants to live. He is able to ignore them usually unless they are really loud. HE has acted on those voices "many years ago when they told me to overdose."  Restlessness is improving a lot and he is able to sit still much more today. He does not feel the urge to stand up and move around a lot. He is sleeping well except he does have occasionally nightmares about "people wanting to hurt me." He is looking forward to follow up with [facility]. He has been attending groups and states that he was able to stand up today and tell the group his story. He states that groups have been helpful and it is helpful to hear other people's stories and knows he is "not alone." Pt is tolerating medications well and does not feel too tired during the day. Appetite is good. He has been talking to his daughter daily and misses her.   Principal Problem: Schizoaffective disorder, depressive type (HCC) Diagnosis:   Patient Active Problem List   Diagnosis Date Noted  . Akathisia [G25.71] 12/19/2016  . Suicidal ideation [R45.851] 12/19/2016  . Psychosis (HCC) [F29] 12/19/2016  . Schizoaffective disorder, depressive type (HCC) [F25.1] 12/19/2016  . PTSD (post-traumatic stress disorder) [F43.10] 03/25/2016  . Marijuana abuse [F12.10] 03/25/2016  . Noncompliance [Z91.19] 03/25/2016  . History of cocaine abuse [Z87.898] 03/25/2016  . Non-ST elevation (NSTEMI) myocardial infarction (HCC) [I21.4]   . Sepsis (HCC) [A41.9] 03/23/2016   Total Time spent with  patient: 20 minutes  Past Psychiatric History: See H&P  Past Medical History:  Past Medical History:  Diagnosis Date  . Hypertension   . MI (myocardial infarction) [facility](HCC)     Past Surgical History:  Procedure Laterality Date  . CARDIAC CATHETERIZATION     Family History:  Family History  Problem Relation Age of Onset  . Heart attack Maternal Uncle   . Heart attack Paternal Uncle    Family Psychiatric  History: See H&P Social History:  Social History   Substance and Sexual Activity  Alcohol Use No   Comment: Stated he stopped 9 yrs ago     Social History   Substance and Sexual Activity  Drug Use No    Social History   Socioeconomic History  . Marital status: Single    Spouse name: None  . Number of children: None  . Years of education: None  . Highest education level: None  Social Needs  . Financial resource strain: None  . Food insecurity - worry: None  . Food insecurity - inability: None  . Transportation needs - medical: None  . Transportation needs - non-medical: None  Occupational History  . None  Tobacco Use  . Smoking status: Current Every Day Smoker    Packs/day: 0.25    Years: 30.00    Pack years: 7.50    Types: Cigarettes  . Smokeless tobacco: Never Used  . Tobacco comment: quit 5 weeks ago, presently wearing a patch  Substance and Sexual Activity  . Alcohol use: No    Comment: Stated he stopped 9 yrs ago  . Drug use: No  . Sexual activity: No    Birth control/protection: None  Other Topics Concern  . None  Social History Narrative  . None    Sleep: Good  Appetite:  Good  Current Medications: Current Facility-Administered Medications  Medication Dose Route Frequency Provider Last Rate Last Dose  . acetaminophen (TYLENOL) tablet 650 mg  650 mg Oral Q6H PRN Clapacs, Jackquline DenmarkJohn T, MD   650 mg at 12/24/16 1426  . alum & mag hydroxide-simeth (MAALOX/MYLANTA) 200-200-20 MG/5ML suspension 30 mL  30 mL Oral Q4H PRN Clapacs, [name], MD   30 mL at  12/21/16 1446  . benztropine (COGENTIN) tablet 1 mg  1 mg Oral BID Clapacs, Jackquline DenmarkJohn T, MD   1 mg at 12/24/16 0805  . docusate sodium (COLACE) capsule 100 mg  100 mg Oral BID PRN Darliss RidgelKapur, Aarti K, MD   100 mg at 12/23/16 1453  . famotidine (PEPCID) tablet 20 mg  20 mg Oral Daily Darliss RidgelKapur, Aarti K, MD   20 mg at 12/24/16 0805  . gabapentin (NEURONTIN) tablet 600 mg  600 mg Oral TID Haskell RilingMcNew, Holly R, MD   600 mg at 12/24/16 1208  . hydrOXYzine (ATARAX/VISTARIL) tablet 50 mg  50 mg Oral TID PRN Clapacs, Jackquline DenmarkJohn T, MD   50 mg at 12/22/16 2143  . LORazepam (ATIVAN) tablet 0.5 mg  0.5 mg Oral Q6H PRN Haskell RilingMcNew, Holly R, MD   0.5 mg at 12/24/16 1426  . magnesium hydroxide (MILK OF MAGNESIA) suspension 30 mL  30 mL Oral Daily PRN Clapacs, [name], MD      . nicotine (NICODERM CQ - dosed in mg/24 hours) patch 21 mg  21 mg Transdermal Daily [name], [name], MD   21 mg at 12/24/16 0804  . OLANZapine (ZYPREXA) tablet 15 mg  15 mg Oral QHS Darliss RidgelKapur, Aarti K, MD   15 mg at 12/23/16 2156  . OLANZapine (ZYPREXA) tablet 5 mg  5 mg Oral Daily Darliss RidgelKapur, Aarti K, MD   5 mg at 12/24/16 0805  . propranolol (INDERAL) tablet 40 mg  40 mg Oral TID Haskell RilingMcNew, Holly R, MD   40 mg at 12/24/16 1208  . traZODone (DESYREL) tablet 150 mg  150 mg Oral QHS McNew, Ileene HutchinsonHolly R, MD   150 mg at 12/23/16 2156    Lab Results: No results found for this or any previous visit (from the past 48 hour(s)).  Blood Alcohol level:  [facility]: [facility]  Metabolic Disorder Labs: Lab Results  Component Value Date   HGBA1C 5.7 (H) 12/20/2016   MPG 116.89 12/20/2016   MPG 108 03/24/2016   No results found for: PROLACTIN Lab Results  Component Value Date   CHOL 290 (H) 12/20/2016   TRIG 165 (H) 12/20/2016   HDL 42 12/20/2016   CHOLHDL 6.9 12/20/2016   VLDL 33 12/20/2016   LDLCALC 215 (H) 12/20/2016   LDLCALC 113 (H) 03/24/2016    Physical Findings: AIMS:  , ,  ,  ,    CIWA:    COWS:     Musculoskeletal: Strength & Muscle Tone: within normal  limits Gait & Station: normal Patient leans: N/A  Psychiatric Specialty Exam: Physical Exam  Nursing note and vitals reviewed.   ROS  Blood pressure 118/77, pulse 72, temperature 97.7 F (36.5 C), temperature source Oral, resp. rate 18, height 5\' 8"  (1.727 m),  weight 73 kg (161 lb), SpO2 96 %.Body mass index is 24.48 kg/m.  General Appearance: Casual  Eye Contact:  Good  Speech:  Clear and Coherent  Volume:  Normal  Mood:  Euthymic  Affect:  Constricted  Thought Process:  Goal Directed  Orientation:  Full (Time, Place, and Person)  Thought Content:  Hallucinations: Auditory  Suicidal Thoughts:  No  Homicidal Thoughts:  No  Memory:  Immediate;   Fair  Judgement:  Fair  Insight:  Fair  Psychomotor Activity:  Restlessness  Concentration:  Concentration: Fair  Recall:  [employer] of Knowledge:  Fair  Language:  Fair  Akathisia:  Yes but improving      Assets:  Communication Skills Desire for Improvement  ADL's:  Intact  Cognition:  WNL  Sleep:  Number of Hours: 6.5     Treatment Plan Summary: 46 yo male who was admitted due to [facility] and akathisia. Pt is showing improvements each day and akathisia is improving each day. He was on high doses of Risperdal which was likely the cause. He has chronic AH which are possibly trauma related as they emerged after a traumatic experience as a young kid. He reports that have never really gone away and frequently tell him to harm himself. He is able to differential that these are not real and is able to ignore them. He has intact reality testing. He has been doing well in groups and will continue to benefit from therapy as an outpatient.  Plan:  Schizoaffective disorder -Continue Zyprexa 5 mg qam and 15 mg qhs  Akathisia -Continue Propranolol 40 mg TID  -Cogentin 1 mg BID -Decrease Ativan to 0.25 mg q 6 hrs  Insomnia -Continue trazodone 150 mg qhs. Will add additional 50 mg prn if he wakes in the middle of the  night  Anxiety -Continue Gabapentin 600 mg TID  Cannabis Use disorder, Opioid use disorder He is not interested in substance use treatment  Dispo -he will return to live with his mother and daughter -Follow up with [facility]  [name], MD 12/24/2016, 3:10 PM

## 2016-12-24 NOTE — [facility] Group Notes (Signed)
LCSW Group Therapy Note 12/24/2016 9:00am  Type of Therapy and Topic:  Group Therapy:  Setting Goals  Participation Level:  Did Not Attend  Description of Group: In this process group, patients discussed using strengths to work toward goals and address challenges.  Patients identified two positive things about themselves and one goal they were working on.  Patients were given the opportunity to share openly and support each other's plan for self-empowerment.  The group discussed the value of gratitude and were encouraged to have a daily reflection of positive characteristics or circumstances.  Patients were encouraged to identify a plan to utilize their strengths to work on current challenges and goals.  Therapeutic Goals 1. Patient will verbalize personal strengths/positive qualities and relate how these can assist with achieving desired personal goals 2. Patients will verbalize affirmation of peers plans for personal change and goal setting 3. Patients will explore the value of gratitude and positive focus as related to successful achievement of goals 4. Patients will verbalize a plan for regular reinforcement of personal positive qualities and circumstances.  Summary of Patient Progress:       Therapeutic Modalities Cognitive Behavioral Therapy Motivational Interviewing    Analysa Nutting P Graham Hyun, LCSW 12/24/2016 11:11 AM   

## 2016-12-24 NOTE — Progress Notes (Signed)
Recreation Therapy Notes  Date: 11.13.18  Time: 3:00pm  Location: Craft room  Behavioral response: N/A  Group Type: Craft  Participation level: N/A  Communication: Patient did not attend group.  Comments: N/A  Keron Koffman LRT/CTRS        Daena Alper 12/24/2016 4:37 PM

## 2016-12-24 NOTE — Plan of Care (Signed)
Patient attends groups. Patient verbalizes feelings of anxiety to staff. Patient educated on medications and remains medication compliant. Patient denies SI, HI, and AVH.

## 2016-12-24 NOTE — [facility] Group Notes (Signed)
  LCSW Group Therapy Note  12/24/2016 1:00 PM  Type of Therapy/Topic:  Group Therapy:  Feelings about Diagnosis  Participation Level:  Active   Description of Group:   This group will allow patients to explore their thoughts and feelings about diagnoses they have received. Patients will be guided to explore their level of understanding and acceptance of these diagnoses. Facilitator will encourage patients to process their thoughts and feelings about the reactions of others to their diagnosis and will guide patients in identifying ways to discuss their diagnosis with significant others in their lives. This group will be process-oriented, with patients participating in exploration of their own experiences, giving and receiving support, and processing challenge from other group members.   Therapeutic Goals: 1. Patient will demonstrate understanding of diagnosis as evidenced by identifying two or more symptoms of the disorder 2. Patient will be able to express two feelings regarding the diagnosis 3. Patient will demonstrate their ability to communicate their needs through discussion and/or role play  Summary of Patient Progress:  James Abbott was able to actively participate in process group. He was able to identify his understanding of his diagnosis and expressed his feelings of hope since learning about his diagnoses.  James Abbott shared that this current hospitalization has been very helpful in him better understanding his diagnoses and getting the treatment that he needs.     Therapeutic Modalities:   Cognitive Behavioral Therapy Brief Therapy Feelings Identification    James FrameSonya S Vastie Douty, LCSW 12/24/2016 1:56 PM

## 2016-12-24 NOTE — Progress Notes (Signed)
Patient denies SI, HI and AVH. Patient complains of pain in bilateral legs and feet, given tylenol. Patient's mood is anxious but pleasant. Patient goes to groups and eats meals adequately. Patient remains medication compliant. Patient is appropriate with staff and his peers on the unit.

## 2016-12-24 NOTE — [facility] Group Notes (Signed)
[facility] Group Notes:  (Nursing/MHT/Case Management/Adjunct)  Date:  12/24/2016  Time:  2:57 PM  Type of Therapy:  Psychoeducational Skills  Participation Level:  Active  Participation Quality:  Appropriate, Sharing and Supportive  Affect:  Appropriate  Cognitive:  Appropriate and Oriented  Insight:  Appropriate  Engagement in Group:  Engaged and Supportive  Modes of Intervention:  Discussion and Education  Summary of Progress/Problems:  Mickey Farberamela M Moses Ellison 12/24/2016, 2:57 PM

## 2016-12-25 MED ORDER — OLANZAPINE 10 MG PO TABS
20.0000 mg | ORAL_TABLET | Freq: Every day | ORAL | Status: DC
  Administered 2016-12-26: 20 mg via ORAL
  Filled 2016-12-25: qty 2

## 2016-12-25 MED ORDER — OLANZAPINE 5 MG PO TABS
15.0000 mg | ORAL_TABLET | Freq: Once | ORAL | Status: AC
  Administered 2016-12-25: 22:00:00 15 mg via ORAL
  Filled 2016-12-25: qty 1

## 2016-12-25 NOTE — Progress Notes (Signed)
Patient is resting quietly and calm no complain, sleep without interruption denies any thoughts of SI/HI and AV/H noted.

## 2016-12-25 NOTE — Progress Notes (Signed)
[facility]  12/25/2016 3:50 PM James Abbott  MRN:  [id number]   Subjective:  Pt states that he is feeling down today. He has been in his room most of the day. He denies any triggers or changes from yesterday. Mood was pretty good yesterday. He is feeling slightly anxious at this time. Restlessness improved significantly from observations of him earlier and currently during interview. He slept okay last night. He denies SI currently. He still hears voices but they are much quieter. They were telling him to kill himself earlier today but not now.   Principal Problem: Schizoaffective disorder, depressive type (HCC) Diagnosis:   Patient Active Problem List   Diagnosis Date Noted  . Akathisia [G25.71] 12/19/2016  . Suicidal ideation [R45.851] 12/19/2016  . Psychosis (HCC) [F29] 12/19/2016  . Schizoaffective disorder, depressive type (HCC) [F25.1] 12/19/2016  . PTSD (post-traumatic stress disorder) [F43.10] 03/25/2016  . Marijuana abuse [F12.10] 03/25/2016  . Noncompliance [Z91.19] 03/25/2016  . History of cocaine abuse [Z87.898] 03/25/2016  . Non-ST elevation (NSTEMI) myocardial infarction (HCC) [I21.4]   . Sepsis (HCC) [A41.9] 03/23/2016   Total Time spent with patient: 20 minutes  Past Psychiatric History: See h&P  Past Medical History:  Past Medical History:  Diagnosis Date  . Hypertension   . MI (myocardial infarction) [facility](HCC)     Past Surgical History:  Procedure Laterality Date  . CARDIAC CATHETERIZATION     Family History:  Family History  Problem Relation Age of Onset  . Heart attack Maternal Uncle   . Heart attack Paternal Uncle    Family Psychiatric  History: See H&P Social History:  Social History   Substance and Sexual Activity  Alcohol Use No   Comment: Stated he stopped 9 yrs ago     Social History   Substance and Sexual Activity  Drug Use No    Social History   Socioeconomic History  . Marital status: Single    Spouse name: None  .  Number of children: None  . Years of education: None  . Highest education level: None  Social Needs  . Financial resource strain: None  . Food insecurity - worry: None  . Food insecurity - inability: None  . Transportation needs - medical: None  . Transportation needs - non-medical: None  Occupational History  . None  Tobacco Use  . Smoking status: Current Every Day Smoker    Packs/day: 0.25    Years: 30.00    Pack years: 7.50    Types: Cigarettes  . Smokeless tobacco: Never Used  . Tobacco comment: quit 5 weeks ago, presently wearing a patch  Substance and Sexual Activity  . Alcohol use: No    Comment: Stated he stopped 9 yrs ago  . Drug use: No  . Sexual activity: No    Birth control/protection: None  Other Topics Concern  . None  Social History Narrative  . None   Sleep: Fair  Appetite:  Good  Current Medications: Current Facility-Administered Medications  Medication Dose Route Frequency Provider Last Rate Last Dose  . acetaminophen (TYLENOL) tablet 650 mg  650 mg Oral Q6H PRN Clapacs, Jackquline DenmarkJohn T, MD   650 mg at 12/25/16 1545  . alum & mag hydroxide-simeth (MAALOX/MYLANTA) 200-200-20 MG/5ML suspension 30 mL  30 mL Oral Q4H PRN Clapacs, [name], MD   30 mL at 12/21/16 1446  . benztropine (COGENTIN) tablet 1 mg  1 mg Oral BID Clapacs, Jackquline DenmarkJohn T, MD   1 mg at 12/25/16 0815  .  docusate sodium (COLACE) capsule 100 mg  100 mg Oral BID PRN [name], MD   100 mg at 12/25/16 1205  . famotidine (PEPCID) tablet 20 mg  20 mg Oral Daily [name], MD   20 mg at 12/25/16 0815  . gabapentin (NEURONTIN) tablet 600 mg  600 mg Oral TID [name], MD   600 mg at 12/25/16 1204  . hydrOXYzine (ATARAX/VISTARIL) tablet 50 mg  50 mg Oral TID PRN Clapacs, [name], MD   50 mg at 12/22/16 2143  . LORazepam (ATIVAN) tablet 0.25 mg  0.25 mg Oral Q6H PRN [name], MD   0.25 mg at 12/25/16 1545  . magnesium hydroxide (MILK OF MAGNESIA) suspension 30 mL  30 mL Oral Daily PRN Clapacs,  [name], MD      . nicotine (NICODERM CQ - dosed in mg/24 hours) patch 21 mg  21 mg Transdermal Daily [name], [name], MD   21 mg at 12/25/16 0818  . OLANZapine (ZYPREXA) tablet 15 mg  15 mg Oral QHS [name], MD   15 mg at 12/24/16 2116  . OLANZapine (ZYPREXA) tablet 5 mg  5 mg Oral Daily [name], MD   5 mg at 12/25/16 0815  . propranolol (INDERAL) tablet 40 mg  40 mg Oral TID [name], MD   40 mg at 12/25/16 1204  . traZODone (DESYREL) tablet 150 mg  150 mg Oral QHS [name], MD   150 mg at 12/24/16 2116  . traZODone (DESYREL) tablet 50 mg  50 mg Oral QHS PRN Emir Nack, Ileene Hutchinson, MD        Lab Results: No results found for this or any previous visit (from the past 48 hour(s)).  Blood Alcohol level:  No results found for: [facility]  Metabolic Disorder Labs: Lab Results  Component Value Date   HGBA1C 5.7 (H) 12/20/2016   MPG 116.89 12/20/2016   MPG 108 03/24/2016   No results found for: PROLACTIN Lab Results  Component Value Date   CHOL 290 (H) 12/20/2016   TRIG 165 (H) 12/20/2016   HDL 42 12/20/2016   CHOLHDL 6.9 12/20/2016   VLDL 33 12/20/2016   LDLCALC 215 (H) 12/20/2016   LDLCALC 113 (H) 03/24/2016    Physical Findings: AIMS:  , ,  ,  ,    CIWA:    COWS:     Musculoskeletal: Strength & Muscle Tone: within normal limits Gait & Station: normal Patient leans: N/A  Psychiatric Specialty Exam: Physical Exam  Nursing note and vitals reviewed.   Review of Systems  All other systems reviewed and are negative.   Blood pressure 118/74, pulse 66, temperature 97.9 F (36.6 C), temperature source Oral, resp. rate 18, height 5\' 8"  (1.727 m), weight 73 kg (161 lb), SpO2 98 %.Body mass index is 24.48 kg/m.  General Appearance: Casual  Eye Contact:  Good  Speech:  Clear and Coherent  Volume:  Normal  Mood:  Depressed  Affect:  Constricted  Thought Process:  Goal Directed  Orientation:  Full (Time, Place, and Person)  Thought Content:  Hallucinations:  Auditory  Suicidal Thoughts:  Yes.  without intent/plan  Homicidal Thoughts:  No  Memory:  Immediate;   Fair  Judgement:  Intact  Insight:  Fair  Psychomotor Activity:  Negative  Concentration:  Concentration: Fair  Recall:  [employer] of Knowledge:  Fair  Language:  Fair  Akathisia:  Yes  Assets:  Communication Skills Desire for Improvement  ADL's:  Intact  Cognition:  WNL  Sleep:  Number of Hours: 5     Treatment Plan Summary: 46 yo male admitted due to command hallucinations and akathisia. Voices are improving on Zyprexa. Akathisia is also improving significantly and is much less restless. Pt states that he is feeling depressed today and affect does appear depressed. He was feeling fine yesterday.   Plan:  Schizoaffective disorder -Change Zyprexa to all dosing at bedtime due to possible daytime sedation. It will be 20 mg qhs.   Akathisia -Continue Propranolol 40 mg TID -Cogentin 1 mg BID -Continue Ativan 0.25 mg q6hrs. Plan to d/c this tomorrow  Insomnia -Continue trazodone 150 mg qhs  Anxiety -Continue Gabapentin 600 mg TID  Dispo -he will return to live with his mother and daughter on discharge -He will follow up with [facility]  Haskell RilingHolly R Bethsaida Siegenthaler, MD 12/25/2016, 3:50 PM

## 2016-12-25 NOTE — Tx Team (Signed)
Interdisciplinary Treatment and Diagnostic Plan Update  12/25/2016 Time of Session: 11:00am James Abbott MRN: [id number]  Principal Diagnosis: Schizoaffective disorder, depressive type [facility])  Secondary Diagnoses: Principal Problem:   Schizoaffective disorder, depressive type ([place])   Current Medications:  Current Facility-Administered Medications  Medication Dose Route Frequency Provider Last Rate Last Dose  . acetaminophen (TYLENOL) tablet 650 mg  650 mg Oral Q6H PRN Clapacs, [name], MD   650 mg at 12/24/16 2118  . alum & mag hydroxide-simeth (MAALOX/MYLANTA) 200-200-20 MG/5ML suspension 30 mL  30 mL Oral Q4H PRN Clapacs, [name], MD   30 mL at 12/21/16 1446  . benztropine (COGENTIN) tablet 1 mg  1 mg Oral BID Clapacs, [name], MD   1 mg at 12/25/16 0815  . docusate sodium (COLACE) capsule 100 mg  100 mg Oral BID PRN Chauncey Mann, MD   100 mg at 12/25/16 1205  . famotidine (PEPCID) tablet 20 mg  20 mg Oral Daily Chauncey Mann, MD   20 mg at 12/25/16 0815  . gabapentin (NEURONTIN) tablet 600 mg  600 mg Oral TID [name], MD   600 mg at 12/25/16 1204  . hydrOXYzine (ATARAX/VISTARIL) tablet 50 mg  50 mg Oral TID PRN Clapacs, [name], MD   50 mg at 12/22/16 2143  . LORazepam (ATIVAN) tablet 0.25 mg  0.25 mg Oral Q6H PRN [name], MD   0.25 mg at 12/25/16 0825  . magnesium hydroxide (MILK OF MAGNESIA) suspension 30 mL  30 mL Oral Daily PRN Clapacs, [name], MD      . nicotine (NICODERM CQ - dosed in mg/24 hours) patch 21 mg  21 mg Transdermal Daily [name], [name], MD   21 mg at 12/25/16 0818  . OLANZapine (ZYPREXA) tablet 15 mg  15 mg Oral QHS Chauncey Mann, MD   15 mg at 12/24/16 2116  . OLANZapine (ZYPREXA) tablet 5 mg  5 mg Oral Daily Chauncey Mann, MD   5 mg at 12/25/16 0815  . propranolol (INDERAL) tablet 40 mg  40 mg Oral TID [name], MD   40 mg at 12/25/16 1204  . traZODone (DESYREL) tablet 150 mg  150 mg Oral QHS [name], MD   150 mg at 12/24/16  2116  . traZODone (DESYREL) tablet 50 mg  50 mg Oral QHS PRN McNew, [name], MD       PTA Medications: Medications Prior to Admission  Medication Sig Dispense Refill Last Dose  . apixaban (ELIQUIS) 5 MG TABS tablet Take 2 tablets (10 mg total) by mouth 2 (two) times daily. Take 2 tabs PO twice a day for 1 week and then 1 tab PO twice a day after that (Patient taking differently: Take 5 mg by mouth 2 (two) times daily. ) 60 tablet 0 unknown at unknown  . aspirin EC 81 MG tablet Take 1 tablet (81 mg total) by mouth daily. (Patient not taking: Reported on 12/18/2016) 90 tablet 3 Not Taking at Unknown time  . atorvastatin (LIPITOR) 40 MG tablet Take 1 tablet (40 mg total) by mouth daily at 6 PM. (Patient not taking: Reported on 12/18/2016) 30 tablet 2 Not Taking at unknown  . clopidogrel (PLAVIX) 75 MG tablet Take 1 tablet (75 mg total) by mouth daily. (Patient not taking: Reported on 12/18/2016) 90 tablet 3 Not Taking at Unknown time  . gabapentin (NEURONTIN) 300 MG capsule Take 300 mg 3 (three) times daily by mouth.  unknown at unknown  . metoprolol tartrate (LOPRESSOR) 25 MG tablet Take 0.5 tablets (12.5 mg total) by mouth 2 (two) times daily. 30 tablet 2 unknown at unknown  . propranolol (INDERAL) 40 MG tablet Take 40 mg daily by mouth.   unknown at unknown  . risperidone (RISPERDAL) 4 MG tablet Take 4 mg 2 (two) times daily by mouth.   unknown at unknown  . traZODone (DESYREL) 100 MG tablet Take 1 tablet (100 mg total) by mouth at bedtime. 30 tablet 2 unknown at unknown    Patient Stressors: Medication change or noncompliance Other: Life  Patient Strengths: Ability for insight Capable of independent living Communication skills Physical Health Supportive family/friends  Treatment Modalities: Medication Management, Group therapy, Case management,  1 to 1 session with clinician, Psychoeducation, Recreational therapy.   Physician Treatment Plan for Primary Diagnosis: Schizoaffective  disorder, depressive type ([place]) Long Term Goal(s): Improvement in symptoms so as ready for discharge Improvement in symptoms so as ready for discharge   Short Term Goals: Ability to verbalize feelings will improve Ability to disclose and discuss suicidal ideas Compliance with prescribed medications will improve Ability to identify triggers associated with substance abuse/mental health issues will improve  Medication Management: Evaluate patient's response, side effects, and tolerance of medication regimen.  Therapeutic Interventions: 1 to 1 sessions, Unit Group sessions and Medication administration.  Evaluation of Outcomes: Progressing  Physician Treatment Plan for Secondary Diagnosis: Principal Problem:   Schizoaffective disorder, depressive type ([place])  Long Term Goal(s): Improvement in symptoms so as ready for discharge Improvement in symptoms so as ready for discharge   Short Term Goals: Ability to verbalize feelings will improve Ability to disclose and discuss suicidal ideas Compliance with prescribed medications will improve Ability to identify triggers associated with substance abuse/mental health issues will improve     Medication Management: Evaluate patient's response, side effects, and tolerance of medication regimen.  Therapeutic Interventions: 1 to 1 sessions, Unit Group sessions and Medication administration.  Evaluation of Outcomes:Progressing   RN Treatment Plan for Primary Diagnosis: Schizoaffective disorder, depressive type ([place]) Long Term Goal(s): Knowledge of disease and therapeutic regimen to maintain health will improve  Short Term Goals: Ability to verbalize feelings will improve, Ability to identify and develop effective coping behaviors will improve and Compliance with prescribed medications will improve  Medication Management: RN will administer medications as ordered by provider, will assess and evaluate patient's response and provide education to  patient for prescribed medication. RN will report any adverse and/or side effects to prescribing provider.  Therapeutic Interventions: 1 on 1 counseling sessions, Psychoeducation, Medication administration, Evaluate responses to treatment, Monitor vital signs and CBGs as ordered, Perform/monitor CIWA, COWS, AIMS and Fall Risk screenings as ordered, Perform wound care treatments as ordered.  Evaluation of Outcomes: Progressing   LCSW Treatment Plan for Primary Diagnosis: Schizoaffective disorder, depressive type ([place]) Long Term Goal(s): Safe transition to appropriate next level of care at discharge, Engage patient in therapeutic group addressing interpersonal concerns.  Short Term Goals: Engage patient in aftercare planning with referrals and resources, Increase social support and Increase skills for wellness and recovery  Therapeutic Interventions: Assess for all discharge needs, 1 to 1 time with Social worker, Explore available resources and support systems, Assess for adequacy in community support network, Educate family and significant other(s) on suicide prevention, Complete Psychosocial Assessment, Interpersonal group therapy.  Evaluation of Outcomes: Not Met   Progress in Treatment: Attending groups: No. Participating in groups: No. Taking medication as prescribed: Yes. Toleration medication:  Yes. Family/Significant other contact made: No, will contact:  when given permission Patient understands diagnosis: Yes. Discussing patient identified problems/goals with staff: Yes. Medical problems stabilized or resolved: Yes. Denies suicidal/homicidal ideation: Yes. Issues/concerns per patient self-inventory: No. Other: none  New problem(s) identified: No, Describe:  none  New Short Term/Long Term Goal(s):Pt goal: "get my anxiety under control."  Discharge Plan or Barriers: Discharge home with follow up at [facility] for outpatient Med management and therapy  Reason for Continuation of  Hospitalization: Depression Medication stabilization  Estimated Length of Stay: 2-3 days.  Recreational Therapy: Patient Stressors: Hearing voices Patient Goal: Patient will identify 3 positive coping skills to better manage anxiety x5 days.  Attendees: Patient: 12/25/2016   Physician: Dr. Wonda Olds, MD 12/25/2016   Nursing: Polly Cobia RN 12/25/2016   RN Care Manager: 12/25/2016   Social Worker: Dossie Arbour, LCSW 12/25/2016   Recreational Therapist:    Other:    Other:    Other:        Scribe for Treatment Team: August Saucer, LCSW 12/25/2016 1:27 PM

## 2016-12-25 NOTE — Progress Notes (Signed)
D- Patient alert and oriented. Patient presents to be very anxious rating his anxiety level at "7/10" and is asking for "Atiivan" after this writer offered to give him "Vistaril". Patient reports pain in his "knees and feet" and rates his pain level at "8/10" but didn't request any pain medication from this Clinical research associatewriter. Denies SI, HI, AVH, at this time.   A- Scheduled medications administered to patient, per MD orders. Support and encouragement provided.  Routine safety checks conducted every 15 minutes.  Patient informed to notify staff with problems or concerns.  R- No adverse drug reactions noted. Patient contracts for safety at this time. Patient compliant with medications and treatment plan. Patient receptive, calm, and cooperative. Patient interacts well with others on the unit.  Patient remains safe at this time.

## 2016-12-25 NOTE — Plan of Care (Signed)
Patient presents pleasant and is in compliance with medication/ treatment regimen. Patient able to verbalize pain/anxiety level as well as emotional status. Patient presents demonstrating self-control. Patient has been free of injury and remains safe on the unit.

## 2016-12-25 NOTE — [facility] Group Notes (Signed)
[facility] Group Notes:  (Nursing/MHT/Case Management/Adjunct)  Date:  12/25/2016  Time:  9:26 PM  Type of Therapy:  Group Therapy  Participation Level:  Active  Participation Quality:  Appropriate  Affect:  Appropriate  Cognitive:  Alert  Insight:  Good  Engagement in Group:  Engaged  Modes of Intervention:  Support  Summary of Progress/Problems:  Mayra NeerJackie L Akua Blethen 12/25/2016, 9:26 PM

## 2016-12-25 NOTE — [facility] Group Notes (Signed)
LCSW Group Therapy Note   12/25/2016 1:15pm   Type of Therapy and Topic:  Group Therapy:  Emotion Regulation   Participation Level:  Minimal  Description of Group: The purpose of this group is to assist patients in learning to regulate negative emotions and experience positive emotions.  Patients will be guided to discuss ways in which they have been vulnerable to their negative emotions.  These vulnerabilities will be juxtaposed with experiences of positive emotions or situations, and patients will be challenged to use positive emotions to combat negative ones.  Special emphasis will be placed on coping with negative emotions in conflict situations, and patients will process healthy conflict resolution skills.  Therapeutic Goals 1. Patient will identify two positive emotions or experiences to reflect on in order to balance out negative emotions. 2. Patient will label two or more emotions that they find the most difficult to experience. 3. Patient will demonstrate positive conflict resolution skills through discussion and/or role plays.  Summary of Patient Progress: James Abbott participated minimally in process group.  He shared that he has experienced some negative emotions and positive emotions and have tried in the past to regulate his negative emotions, but this has been a struggle for him.  James Abbott was in obvious pain during the group which limited him ability to fully participate.    Therapeutic Modalities Cognitive Behavioral Therapy Feelings Identification Dialectical Behavioral Therapy  Alease FrameSonya S Rayder Sullenger, LCSW 12/25/2016 5:41 PM

## 2016-12-26 MED ORDER — GABAPENTIN 600 MG PO TABS
900.0000 mg | ORAL_TABLET | Freq: Three times a day (TID) | ORAL | Status: DC
Start: 2016-12-26 — End: 2016-12-27
  Administered 2016-12-26 – 2016-12-27 (×3): 900 mg via ORAL
  Filled 2016-12-26 (×3): qty 2

## 2016-12-26 MED ORDER — OLANZAPINE 20 MG PO TABS: 20.0000 mg | ORAL_TABLET | Freq: Every day | ORAL | 0 refills | Status: DC

## 2016-12-26 MED ORDER — BENZTROPINE MESYLATE 1 MG PO TABS: 1.0000 mg | ORAL_TABLET | Freq: Two times a day (BID) | ORAL | 0 refills | Status: DC

## 2016-12-26 MED ORDER — MENTHOL 3 MG MT LOZG
1.0000 | LOZENGE | OROMUCOSAL | Status: DC | PRN
  Administered 2016-12-26 – 2016-12-27 (×6): 3 mg via ORAL
  Filled 2016-12-26 (×2): qty 9

## 2016-12-26 MED ORDER — APIXABAN 5 MG PO TABS: 5.0000 mg | ORAL_TABLET | Freq: Two times a day (BID) | ORAL | 0 refills | Status: AC

## 2016-12-26 MED ORDER — HYDROXYZINE HCL 50 MG PO TABS: 50.0000 mg | ORAL_TABLET | Freq: Three times a day (TID) | ORAL | 0 refills | Status: DC | PRN

## 2016-12-26 MED ORDER — TRAZODONE HCL 150 MG PO TABS: 150.0000 mg | ORAL_TABLET | Freq: Every day | ORAL | 0 refills | Status: DC

## 2016-12-26 MED ORDER — PROPRANOLOL HCL 40 MG PO TABS: 40.0000 mg | ORAL_TABLET | Freq: Three times a day (TID) | ORAL | 0 refills | Status: DC

## 2016-12-26 MED ORDER — IBUPROFEN 600 MG PO TABS
600.0000 mg | ORAL_TABLET | ORAL | Status: DC | PRN
  Administered 2016-12-26 – 2016-12-27 (×2): 600 mg via ORAL
  Filled 2016-12-26 (×2): qty 1

## 2016-12-26 MED ORDER — APIXABAN 5 MG PO TABS
5.0000 mg | ORAL_TABLET | Freq: Two times a day (BID) | ORAL | Status: DC
  Administered 2016-12-26 – 2016-12-27 (×2): 5 mg via ORAL
  Filled 2016-12-26 (×3): qty 1

## 2016-12-26 MED ORDER — GABAPENTIN 600 MG PO TABS: 900.0000 mg | ORAL_TABLET | Freq: Three times a day (TID) | ORAL | 0 refills | Status: DC
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
## 2016-12-26 NOTE — Plan of Care (Signed)
Pt safe on the unit at this time 
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
## 2016-12-26 NOTE — Progress Notes (Signed)
D: Pt  Is alert and oriented x 4 denies SI/HI/AVH, affect is bright on approach. Pt is pleasant and cooperative with treatment plan, his thought process is organized, he appears less anxious and he is interacting with peers and staff appropriately.  A: Pt was offered support and encouragement, given scheduled medications and was encouraged to attend groups. 15 minute checks were done for safety.  R:Pt attends groups and interacts well with peers and staff. Pt is complaint with medication and receptive to treatment and safety maintained on unit will continue to monitor.

## 2016-12-26 NOTE — Plan of Care (Signed)
  Coping: Ability to verbalize frustrations and anger appropriately will improve 12/26/2016 0402 - Progressing by Trula OreAjetunmobi, Jacques Willingham Abisola, RN
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
## 2016-12-26 NOTE — [facility] Group Notes (Signed)
[facility] Group Notes:  (Nursing/MHT/Case Management/Adjunct)  Date:  12/26/2016  Time:  9:40 PM  Type of Therapy:  Group Therapy  Participation Level:  Active  Participation Quality:  Appropriate  Affect:  Appropriate  Cognitive:  Alert  Insight:  Good  Engagement in Group:  Engaged  Modes of Intervention:  Activity  Summary of Progress/Problems:  Mayra NeerJackie L Zamani Crocker 12/26/2016, 9:40 PM

## 2016-12-26 NOTE — Progress Notes (Signed)
D- Patient alert and oriented. Patient presents in a depressed mood stating that he rates his depression as a "7/10" but doesn't understand why he's depressed. Denies SI/ HI at this time. Patient does endorse HI stating that he's still hearing voices telling me to "kill myself, but I don't want to". Patient complains of his throat being sore and rating his pain level as a "9/10".   A- Scheduled medications administered to patient, per MD orders. Support and encouragement provided.  Routine safety checks conducted every 15 minutes.  Patient informed to notify staff with problems or concerns.  R- No adverse drug reactions noted. Patient contracts for safety at this time. Patient compliant with medications and treatment plan. Patient receptive, calm, and cooperative. Patient interacts well with others on the unit.  Patient remains safe at this time.

## 2016-12-27 MED ORDER — TRAZODONE HCL 150 MG PO TABS: 150.0000 mg | ORAL_TABLET | Freq: Every day | ORAL | 1 refills | Status: AC

## 2016-12-27 MED ORDER — GABAPENTIN 600 MG PO TABS: 900.0000 mg | ORAL_TABLET | Freq: Three times a day (TID) | ORAL | 1 refills | Status: AC

## 2016-12-27 MED ORDER — MENTHOL 3 MG MT LOZG
1.0000 | LOZENGE | Freq: Once | OROMUCOSAL | Status: AC
Start: 2016-12-27 — End: 2016-12-27
  Administered 2016-12-27: 3 mg via ORAL
  Filled 2016-12-27: qty 9

## 2016-12-27 MED ORDER — PROPRANOLOL HCL 40 MG PO TABS: 40.0000 mg | ORAL_TABLET | Freq: Three times a day (TID) | ORAL | 1 refills | Status: AC

## 2016-12-27 MED ORDER — HYDROXYZINE HCL 50 MG PO TABS: 50.0000 mg | ORAL_TABLET | Freq: Three times a day (TID) | ORAL | 1 refills | Status: AC | PRN

## 2016-12-27 MED ORDER — OLANZAPINE 20 MG PO TABS: 20.0000 mg | ORAL_TABLET | Freq: Every day | ORAL | 1 refills | Status: AC

## 2016-12-27 MED ORDER — BENZTROPINE MESYLATE 1 MG PO TABS: 1.0000 mg | ORAL_TABLET | Freq: Two times a day (BID) | ORAL | 1 refills | Status: AC

## 2016-12-27 NOTE — Progress Notes (Signed)
Patient scheduled to be discharged home today with follow appt in place. Patient will be given a bus ticket for transport back to [place]. Discharge plans reviewd with patient with verbal understanding of information being presented. Verbally denies SI/HI/AVH. Will be discharged with a 7 day supply of medication and prescriptions.

## 2016-12-27 NOTE — Progress Notes (Signed)
Patient discharged on above date and time. Personal items, Rx's, medications and discharge paperwork in hand. Patient received a coat from MD, client very appreciative.Transported to the bus stop by courtesy driver. No complaints of voiced

## 2016-12-27 NOTE — Progress Notes (Signed)
Recreation Therapy Notes  INPATIENT RECREATION TR PLAN  Patient Details Name: James Abbott MRN: [id number] DOB: Jun 14, 1970 Today's Date: 12/27/2016  Rec Therapy Plan Is patient appropriate for Therapeutic Recreation?: Yes Treatment times per week: at least 3 Estimated Length of Stay: 5-7 days TR Treatment/Interventions: Group participation (Comment)(Appropriate participation in recreation therapy tx.)  Discharge Criteria Pt will be discharged from therapy if:: Discharged Treatment plan/goals/alternatives discussed and agreed upon by:: Patient/family  Discharge Summary Short term goals set: Patient will identify 3 positive coping skills to better manage anxiety x5 days.  Short term goals met: Adequate for discharge Progress toward goals comments: Groups attended Which groups?: Communication, Other (Comment)(Creative Expressions) Reason goals not met: N/A Therapeutic equipment acquired: N/A Reason patient discharged from therapy: Discharge from hospital Pt/family agrees with progress & goals achieved: Yes Date patient discharged from therapy: 12/27/16   Madalaine Portier 12/27/2016, 2:53 PM

## 2016-12-27 NOTE — Discharge Summary (Signed)
Physician Discharge Summary Note  Patient:  James Abbott is an 46 y.o., male MRN:  [id number] DOB:  08-25-1970 Patient phone:  [phone] (home)  Patient address:   [address] [place] [id number],  Total Time spent with patient: 20 minutes  Plus 20 minutes of medication reconciliation, discharge planning, and discharge documentation   Date of Admission:  12/19/2016 Date of Discharge: 12/27/16  Reason for Admission:  Command AH  Principal Problem: Schizoaffective disorder, depressive type [facility](HCC) Discharge Diagnoses: Patient Active Problem List   Diagnosis Date Noted  . Akathisia [G25.71] 12/19/2016  . Suicidal ideation [R45.851] 12/19/2016  . Psychosis (HCC) [F29] 12/19/2016  . Schizoaffective disorder, depressive type (HCC) [F25.1] 12/19/2016  . PTSD (post-traumatic stress disorder) [F43.10] 03/25/2016  . Marijuana abuse [F12.10] 03/25/2016  . Noncompliance [Z91.19] 03/25/2016  . History of cocaine abuse [Z87.898] 03/25/2016  . Non-ST elevation (NSTEMI) myocardial infarction (HCC) [I21.4]   . Sepsis (HCC) [A41.9] 03/23/2016    Past Psychiatric History: See H&P Past Medical History:  Past Medical History:  Diagnosis Date  . Hypertension   . MI (myocardial infarction) [facility](HCC)     Past Surgical History:  Procedure Laterality Date  . CARDIAC CATHETERIZATION    . Left Heart Cath and Coronary Angiography N/A 03/24/2016   Performed by Iran OuchArida, Muhammad A, [facility]   Family History:  Family History  Problem Relation Age of Onset  . Heart attack Maternal Uncle   . Heart attack Paternal Uncle    Family Psychiatric  History: See H&P Social History:  Social History   Substance and Sexual Activity  Alcohol Use No   Comment: Stated he stopped 9 yrs ago     Social History   Substance and Sexual Activity  Drug Use No    Social History   Socioeconomic History  . Marital status: Single    Spouse name: None  . Number of children: None  .  Years of education: None  . Highest education level: None  Social Needs  . Financial resource strain: None  . Food insecurity - worry: None  . Food insecurity - inability: None  . Transportation needs - medical: None  . Transportation needs - non-medical: None  Occupational History  . None  Tobacco Use  . Smoking status: Current Every Day Smoker    Packs/day: 0.25    Years: 30.00    Pack years: 7.50    Types: Cigarettes  . Smokeless tobacco: Never Used  . Tobacco comment: quit 5 weeks ago, presently wearing a patch  Substance and Sexual Activity  . Alcohol use: No    Comment: Stated he stopped 9 yrs ago  . Drug use: No  . Sexual activity: No    Birth control/protection: None  Other Topics Concern  . None  Social History Narrative  . None    Hospital Course:  Pt initially presented with significant akathisia. This was thought to be due from Risperdal that he was prescribed at recent hospitalization. He was initially given prn Ativan as it was very significant and this was tapered down. He did ask for benzos at times but discussed that he has history of substance abuse and we will not be continuing this.  Akathisia improved significantly on Propanol and Cogentin. Risperdal was discontinued and he was started on Zyprexa nd titrated upt o 20 mg daily for AH. Ah improved significantly. He states that he always hears voices and they have never completely gone away. They were  much quieter by day of discharge. His AH appears to have a trauma related component as they began at a very young age after a traumatic event. These worsen in times of stress and anxiety. On day of discharge, pt was feeling better. He denied command hallucinations and voices were much quieter. He denied any desire or intent of harming himself and has intact reality testing and knows the voices are not real and is able to ignore them. Pt felt ready to discharge. He planned to follow up with [facility] on Monday. He was looking  forward to seeing his 45 yo daughter this weekend. When asked, he denies SI or thoughts of self harm.Denies HI. He tolerated medications well with no side effects. He was given 7 days supply of medications from our pharmacy.   The patient is at low risk of imminent suicide. Patient denied thoughts, intent, or plan for harm to self or others, expressed significant future orientation, and expressed an ability to mobilize assistance for his needs. he is presently void of any contributing psychiatric symptoms, cognitive difficulties, or substance use which would elevate his risk for lethality. Chronic risk for lethality is elevated in light of history of trauma, poor coping skills. The chronic risk is presently mitigated by his ongoing desire and [facility] and mobilization of support from family and friends. Chronic risk may elevate if he experiences any significant loss or worsening of symptoms, which can be managed and monitored through outpatient providers. At this time,a cute risk for lethality is low and heis stable for ongoing outpatient management.   Modifiable risk factors were addressed during this hospitalization through appropriate pharmacotherapy and establishment of outpatient follow-up treatment. Some risk factors for suicide are situational (i.e. Unstable housing) or related personality pathology (i.e. Poor coping mechanisms) and thus cannot be further mitigated by continued hospitalization in this setting.    Physical Findings: AIMS: Facial and Oral Movements Muscles of Facial Expression: None, normal Lips and Perioral Area: None, normal Jaw: None, normal Tongue: None, normal,Extremity Movements Upper (arms, wrists, hands, fingers): None, normal Lower (legs, knees, ankles, toes): None, normal, Trunk Movements Neck, shoulders, hips: None, normal, Overall Severity Severity of abnormal movements (highest score from questions above): None, normal Incapacitation due to  abnormal movements: None, normal Patient's awareness of abnormal movements (rate only patient's report): No Awareness, Dental Status Current problems with teeth and/or dentures?: No Does patient usually wear dentures?: No  CIWA:    COWS:     Musculoskeletal: Strength & Muscle Tone: within normal limits Gait & Station: normal Patient leans: N/A  Psychiatric Specialty Exam: Physical Exam  Nursing note and vitals reviewed.   Review of Systems  HENT: Positive for sore throat.   All other systems reviewed and are negative.   Blood pressure 122/78, pulse 88, temperature 98.8 F (37.1 C), temperature source Oral, resp. rate 18, height 5\' 8"  (1.727 m), weight 73 kg (161 lb), SpO2 98 %.Body mass index is 24.48 kg/m.  General Appearance: Casual  Eye Contact:  Good  Speech:  Clear and Coherent  Volume:  Normal  Mood:  Depressed  Affect:  Congruent  Thought Process:  Goal Directed  Orientation:  Full (Time, Place, and Person)  Thought Content:  Hallucinations: Auditory but much improved  Suicidal Thoughts:  No  Homicidal Thoughts:  No  Memory:  Immediate;   Fair  Judgement:  Fair  Insight:  Fair  Psychomotor Activity:  Normal  Concentration:  Concentration: Fair  Recall:  Fair  Fund of Knowledge:  Fair  Language:  Fair  Akathisia:  No      Assets:  Communication Skills Desire for Improvement  ADL's:  Intact  Cognition:  WNL  Sleep:  Number of Hours: 6.5        Has this patient used any form of tobacco in the last 30 days? (Cigarettes, Smokeless Tobacco, Cigars, and/or Pipes) Yes, Yes, A prescription for an FDA-approved tobacco cessation medication was offered at discharge and the patient refused  Blood Alcohol level:  No results found for: [facility]  Metabolic Disorder Labs:  Lab Results  Component Value Date   HGBA1C 5.7 (H) 12/20/2016   MPG 116.89 12/20/2016   MPG 108 03/24/2016   No results found for: PROLACTIN Lab Results  Component Value Date   CHOL 290 (H)  12/20/2016   TRIG 165 (H) 12/20/2016   HDL 42 12/20/2016   CHOLHDL 6.9 12/20/2016   VLDL 33 12/20/2016   LDLCALC 215 (H) 12/20/2016   LDLCALC 113 (H) 03/24/2016    See Psychiatric Specialty Exam and Suicide Risk Assessment completed by Attending Physician prior to discharge.  Discharge destination:  Home  Is patient on multiple antipsychotic therapies at discharge:  No   Has Patient had three or more failed trials of antipsychotic monotherapy by history:  No  Recommended Plan for Multiple Antipsychotic Therapies: NA   Allergies as of 12/27/2016      Reactions   Fluoxetine    Haloperidol And Related       Medication List    STOP taking these medications   clopidogrel 75 MG tablet Commonly known as:  PLAVIX   gabapentin 300 MG capsule Commonly known as:  NEURONTIN Replaced by:  gabapentin 600 MG tablet   metoprolol tartrate 25 MG tablet Commonly known as:  LOPRESSOR   risperidone 4 MG tablet Commonly known as:  RISPERDAL     TAKE these medications     Indication  apixaban 5 MG Tabs tablet Commonly known as:  ELIQUIS Take 1 tablet (5 mg total) 2 (two) times daily by mouth. Take 2 tabs PO twice a day for 1 week and then 1 tab PO twice a day after that What changed:  how much to take  Indication:  Cerebrovascular Accident or Stroke   aspirin EC 81 MG tablet Take 1 tablet (81 mg total) by mouth daily.  Indication:  Acute Heart Attack   atorvastatin 40 MG tablet Commonly known as:  LIPITOR Take 1 tablet (40 mg total) by mouth daily at 6 PM.  Indication:  High Amount of Fats in the Blood   benztropine 1 MG tablet Commonly known as:  COGENTIN Take 1 tablet (1 mg total) 2 (two) times daily by mouth.  Indication:  Extrapyramidal Reaction caused by Medications   gabapentin 600 MG tablet Commonly known as:  NEURONTIN Take 1.5 tablets (900 mg total) 3 (three) times daily by mouth. Replaces:  gabapentin 300 MG capsule  Indication:  anxiety,   hydrOXYzine 50 MG  tablet Commonly known as:  ATARAX/VISTARIL Take 1 tablet (50 mg total) 3 (three) times daily as needed by mouth for anxiety.  Indication:  Feeling Anxious   OLANZapine 20 MG tablet Commonly known as:  ZYPREXA Take 1 tablet (20 mg total) at bedtime by mouth.  Indication:  Schizophrenia   propranolol 40 MG tablet Commonly known as:  INDERAL Take 1 tablet (40 mg total) 3 (three) times daily by mouth. What changed:  when to take this  Indication:  Iantha Fallen   traZODone 150 MG tablet Commonly known as:  DESYREL Take 1 tablet (150 mg total) at bedtime by mouth. What changed:    medication strength  how much to take  Indication:  Trouble Sleeping      Follow-up Information    [facility] Go on 12/30/2016.   Why:  7:00am for Hospital Follow up.  [facility] with [facility] will pick you up and transport to your appointment.  [phone].  Please take your hospital discharge paperwork with you. Contact information: [address] [place] [address] [phone]           Follow-up recommendations:See Above for appointments Signed: [name], MD 12/27/2016, 11:22 AM

## 2016-12-27 NOTE — Progress Notes (Signed)
  [facility] Discharge [facility] :  Will you be returning to the same living situation after discharge:  Yes,  return home At discharge, do you have transportation home?: Yes,  will be given bus ticket Do you have the ability to pay for your medications: Yes,  no concerns expressed  Release of information consent forms completed and in the chart.  Patient to Follow up at: Follow-up Information    [facility] Go on 12/30/2016.   Why:  7:00am for Hospital Follow up.  [name] will pick you up and transport to your appointment.  [phone].  Please take your hospital discharge paperwork with you. Contact information: [address] [place] [address] [phone]           Next level of care provider [facility] Link:no  Safety Planning and Suicide Prevention discussed: Yes,  reviewed w patient,  refused collateral contact     Has patient been referred to the Quitline?: N/A patient is not a smoker per CSW assessment  Patient has been referred for addiction treatment: N/A  James Langenne C Analise Glotfelty, LCSW 12/27/2016, 8:59 AM

## 2016-12-27 NOTE — Progress Notes (Signed)
Recreation Therapy Notes   Date: 11.16.18  Time: 9:30 am  Location: Craft Room  Behavioral response: N/A  Intervention Topic: Teambuilding  Discussion/Intervention: Patient did not attend group.  Clinical Observations/Feedback:  Patient did not attend group.  Walter Grima LRT/CTRS          Chanse Kagel 12/27/2016 11:27 AM

## 2016-12-27 NOTE — [facility] Suicide Risk Assessment (Signed)
[facility]   Principal Problem: Schizoaffective disorder, depressive type [facility](HCC) Discharge Diagnoses:  Patient Active Problem List   Diagnosis Date Noted  . Akathisia [G25.71] 12/19/2016  . Suicidal ideation [R45.851] 12/19/2016  . Psychosis (HCC) [F29] 12/19/2016  . Schizoaffective disorder, depressive type (HCC) [F25.1] 12/19/2016  . PTSD (post-traumatic stress disorder) [F43.10] 03/25/2016  . Marijuana abuse [F12.10] 03/25/2016  . Noncompliance [Z91.19] 03/25/2016  . History of cocaine abuse [Z87.898] 03/25/2016  . Non-ST elevation (NSTEMI) myocardial infarction (HCC) [I21.4]   . Sepsis (HCC) [A41.9] 03/23/2016    Total Time spent with patient: 20 minutes  Musculoskeletal: Strength & Muscle Tone: within normal limits Gait & Station: normal Patient leans: N/A  Psychiatric Specialty Exam: Review of Systems  All other systems reviewed and are negative.   Blood pressure 116/72, pulse 62, temperature 98.8 F (37.1 C), temperature source Oral, resp. rate 18, height 5\' 8"  (1.727 m), weight 73 kg (161 lb), SpO2 98 %.Body mass index is 24.48 kg/m.   Mental Status Per Nursing Assessment::   On Admission:  NA  Demographic Factors:  Male and Unemployed  Loss Factors: Decrease in vocational status  Historical Factors: Prior suicide attempts and Impulsivity  Risk Reduction Factors:   Responsible for children under 45 years of age, Positive social support and Positive therapeutic relationship  Continued Clinical Symptoms:  None  Cognitive Features That Contribute To Risk:  None    Suicide Risk:  Mild:  Suicidal ideation of limited frequency, intensity, duration, and specificity.  There are no identifiable plans, no associated intent, mild dysphoria and related symptoms, good self-control (both objective and subjective assessment), few other risk factors, and identifiable protective factors, including available and accessible social  support.  Follow-up Information    [facility] Go on 12/30/2016.   Why:  7:00am for Hospital Follow up.  [name] will pick you up and transport to your appointment.  [phone].  Please take your hospital discharge paperwork with you. Contact information: [address] [place] [address] [phone]           Plan Of Care/Follow-up recommendations:  Follow up with [facility] on Monday  Haskell RilingHolly R Oniya Mandarino, MD 12/27/2016, 11:20 AM

## 2016-12-29 LAB — CULTURE, GROUP A STREP (THRC): SPECIAL REQUESTS: NORMAL

## 2017-05-16 ENCOUNTER — Telehealth: Payer: Self-pay | Admitting: Pharmacy Technician

## 2017-05-16 NOTE — Telephone Encounter (Signed)
Patient failed to provide 2019 financial documentation.  No additional medication assistance will be provided by [facility] without the required proof of income documentation.  Patient notified by letter.  Yuriel Lopezmartinez J. Latonya Nelon Care Manager [facility] 

## 2022-07-01 ENCOUNTER — Other Ambulatory Visit: Payer: Self-pay
